# Patient Record
Sex: Male | Born: 1965
Health system: Southern US, Community
[De-identification: ages and names within clinical notes are randomized; demographics above are authoritative.]

## PROBLEM LIST (undated history)

## (undated) DIAGNOSIS — M1990 Unspecified osteoarthritis, unspecified site: Secondary | ICD-10-CM

## (undated) HISTORY — DX: Unspecified osteoarthritis, unspecified site: M19.90

## (undated) HISTORY — PX: FOREIGN BODY REMOVAL: SHX962

---

## 2001-08-08 HISTORY — PX: APPENDECTOMY: SHX54

## 2008-08-08 HISTORY — PX: OTHER SURGICAL HISTORY: SHX169

## 2011-08-09 HISTORY — PX: OTHER SURGICAL HISTORY: SHX169

## 2020-02-03 ENCOUNTER — Ambulatory Visit (INDEPENDENT_AMBULATORY_CARE_PROVIDER_SITE_OTHER): Payer: 59 | Admitting: Physician Assistant

## 2020-02-03 ENCOUNTER — Other Ambulatory Visit: Payer: Self-pay

## 2020-02-03 ENCOUNTER — Encounter: Payer: Self-pay | Admitting: Physician Assistant

## 2020-02-03 VITALS — BP 162/88 | HR 82 | Temp 98.2°F | Ht 74.0 in | Wt 238.4 lb

## 2020-02-03 DIAGNOSIS — N50811 Right testicular pain: Secondary | ICD-10-CM | POA: Diagnosis not present

## 2020-02-03 DIAGNOSIS — G8929 Other chronic pain: Secondary | ICD-10-CM | POA: Insufficient documentation

## 2020-02-03 DIAGNOSIS — R200 Anesthesia of skin: Secondary | ICD-10-CM | POA: Diagnosis not present

## 2020-02-03 DIAGNOSIS — Z1211 Encounter for screening for malignant neoplasm of colon: Secondary | ICD-10-CM

## 2020-02-03 DIAGNOSIS — M25511 Pain in right shoulder: Secondary | ICD-10-CM

## 2020-02-03 DIAGNOSIS — M25561 Pain in right knee: Secondary | ICD-10-CM

## 2020-02-03 DIAGNOSIS — R030 Elevated blood-pressure reading, without diagnosis of hypertension: Secondary | ICD-10-CM

## 2020-02-03 DIAGNOSIS — R351 Nocturia: Secondary | ICD-10-CM

## 2020-02-03 DIAGNOSIS — N50812 Left testicular pain: Secondary | ICD-10-CM | POA: Diagnosis not present

## 2020-02-03 DIAGNOSIS — I10 Essential (primary) hypertension: Secondary | ICD-10-CM | POA: Insufficient documentation

## 2020-02-03 DIAGNOSIS — G4739 Other sleep apnea: Secondary | ICD-10-CM

## 2020-02-03 LAB — COMPREHENSIVE METABOLIC PANEL
ALT: 18 U/L (ref 0–53)
AST: 17 U/L (ref 0–37)
Albumin: 4.7 g/dL (ref 3.5–5.2)
Alkaline Phosphatase: 49 U/L (ref 39–117)
BUN: 19 mg/dL (ref 6–23)
CO2: 31 mEq/L (ref 19–32)
Calcium: 10.2 mg/dL (ref 8.4–10.5)
Chloride: 102 mEq/L (ref 96–112)
Creatinine, Ser: 0.95 mg/dL (ref 0.40–1.50)
GFR: 82.09 mL/min (ref >60.00)
Glucose, Bld: 98 mg/dL (ref 70–99)
Potassium: 4 mEq/L (ref 3.5–5.1)
Sodium: 141 mEq/L (ref 135–145)
Total Bilirubin: 0.8 mg/dL (ref 0.2–1.2)
Total Protein: 7.2 g/dL (ref 6.0–8.3)

## 2020-02-03 LAB — CBC WITH DIFFERENTIAL/PLATELET
Basophils Absolute: 0 10*3/uL (ref 0.0–0.1)
Basophils Relative: 0.7 % (ref 0.0–3.0)
Eosinophils Absolute: 0.1 10*3/uL (ref 0.0–0.7)
Eosinophils Relative: 1.1 % (ref 0.0–5.0)
HCT: 44.3 % (ref 39.0–52.0)
Hemoglobin: 15.5 g/dL (ref 13.0–17.0)
Lymphocytes Relative: 23.9 % (ref 12.0–46.0)
Lymphs Abs: 1.7 10*3/uL (ref 0.7–4.0)
MCHC: 34.9 g/dL (ref 30.0–36.0)
MCV: 89.5 fl (ref 78.0–100.0)
Monocytes Absolute: 0.6 10*3/uL (ref 0.1–1.0)
Monocytes Relative: 8.8 % (ref 3.0–12.0)
Neutro Abs: 4.7 10*3/uL (ref 1.4–7.7)
Neutrophils Relative %: 65.5 % (ref 43.0–77.0)
Platelets: 216 10*3/uL (ref 150.0–400.0)
RBC: 4.95 Mil/uL (ref 4.22–5.81)
RDW: 13.5 % (ref 11.5–15.5)
WBC: 7.1 10*3/uL (ref 4.0–10.5)

## 2020-02-03 LAB — URINALYSIS, ROUTINE W REFLEX MICROSCOPIC
Bilirubin Urine: NEGATIVE
Hgb urine dipstick: NEGATIVE
Ketones, ur: NEGATIVE
Leukocytes,Ua: NEGATIVE
Nitrite: NEGATIVE
RBC / HPF: NONE SEEN (ref 0–?)
Specific Gravity, Urine: 1.02 (ref 1.000–1.030)
Total Protein, Urine: NEGATIVE
Urine Glucose: NEGATIVE
Urobilinogen, UA: 0.2 (ref 0.0–1.0)
pH: 6.5 (ref 5.0–8.0)

## 2020-02-03 LAB — PSA: PSA: 1.34 ng/mL (ref 0.10–4.00)

## 2020-02-03 LAB — TSH: TSH: 1.05 u[IU]/mL (ref 0.35–4.50)

## 2020-02-03 LAB — VITAMIN B12: Vitamin B-12: 357 pg/mL (ref 211–911)

## 2020-02-03 NOTE — Patient Instructions (Addendum)
It was great to see you!  You will be contacted about your referral to gastroenterology, orthopedics, and neurology (neurology is for the sleep study.)  I will be in touch regarding your lab results.  Please follow-up with Dr. Jerline Pain in 1-2 months.  Take care,  Inda Coke PA-C
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
## 2020-02-04 LAB — URINE CULTURE
MICRO NUMBER:: 10641920
SPECIMEN QUALITY:: ADEQUATE

## 2020-02-13 ENCOUNTER — Ambulatory Visit (INDEPENDENT_AMBULATORY_CARE_PROVIDER_SITE_OTHER): Payer: 59 | Admitting: Orthopaedic Surgery

## 2020-02-13 ENCOUNTER — Other Ambulatory Visit: Payer: Self-pay

## 2020-02-13 ENCOUNTER — Encounter: Payer: Self-pay | Admitting: Orthopaedic Surgery

## 2020-02-13 ENCOUNTER — Ambulatory Visit: Payer: Self-pay

## 2020-02-13 VITALS — Ht 74.0 in | Wt 238.0 lb

## 2020-02-13 DIAGNOSIS — M25511 Pain in right shoulder: Secondary | ICD-10-CM

## 2020-02-13 DIAGNOSIS — M7541 Impingement syndrome of right shoulder: Secondary | ICD-10-CM | POA: Insufficient documentation

## 2020-02-13 DIAGNOSIS — G8929 Other chronic pain: Secondary | ICD-10-CM | POA: Diagnosis not present

## 2020-02-13 DIAGNOSIS — M25561 Pain in right knee: Secondary | ICD-10-CM | POA: Diagnosis not present

## 2020-02-13 MED ORDER — METHYLPREDNISOLONE ACETATE 40 MG/ML IJ SUSP
80.0000 mg | INTRAMUSCULAR | Status: AC | PRN
  Administered 2020-02-13: 80 mg via INTRA_ARTICULAR

## 2020-02-13 MED ORDER — LIDOCAINE HCL 1 % IJ SOLN
2.0000 mL | INTRAMUSCULAR | Status: AC | PRN
  Administered 2020-02-13: 2 mL

## 2020-02-13 MED ORDER — BUPIVACAINE HCL 0.5 % IJ SOLN
2.0000 mL | INTRAMUSCULAR | Status: AC | PRN
  Administered 2020-02-13: 2 mL via INTRA_ARTICULAR
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
## 2020-03-05 ENCOUNTER — Other Ambulatory Visit: Payer: Self-pay

## 2020-03-05 ENCOUNTER — Encounter: Payer: Self-pay | Admitting: Family Medicine

## 2020-03-05 ENCOUNTER — Ambulatory Visit (INDEPENDENT_AMBULATORY_CARE_PROVIDER_SITE_OTHER): Payer: 59 | Admitting: Family Medicine

## 2020-03-05 DIAGNOSIS — R030 Elevated blood-pressure reading, without diagnosis of hypertension: Secondary | ICD-10-CM | POA: Diagnosis not present

## 2020-03-05 DIAGNOSIS — M1990 Unspecified osteoarthritis, unspecified site: Secondary | ICD-10-CM

## 2020-03-05 NOTE — Assessment & Plan Note (Signed)
Slightly above goal today.  He will continue home monitoring goal 140/90 or lower.  He will let me know if persistently elevated above this.  Discussed lifestyle modifications including low-salt diet and regular exercise.

## 2020-03-05 NOTE — Progress Notes (Addendum)
Chief Complaint:  Shane Miles is a 54 y.o. male who presents today for his annual comprehensive physical exam.    Assessment/Plan:  Chronic Problems Addressed Today: Osteoarthritis Stable.  Continue management per orthopedics.  Elevated blood pressure reading Slightly above goal today.  He will continue home monitoring goal 140/90 or lower.  He will let me know if persistently elevated above this.  Discussed lifestyle modifications including low-salt diet and regular exercise.  Preventative Healthcare: Referral already placed for colonoscopy.  Labs from visit a month ago within normal limits.  He will follow-up in 1 year for next CPE.  Encourage patient to get shingles vaccine at pharmacy.  Patient Counseling(The following topics were reviewed and/or handout was given):  -Nutrition: Stressed importance of moderation in sodium/caffeine intake, saturated fat and cholesterol, caloric balance, sufficient intake of fresh fruits, vegetables, and fiber.  -Stressed the importance of regular exercise.   -Substance Abuse: Discussed cessation/primary prevention of tobacco, alcohol, or other drug use; driving or other dangerous activities under the influence; availability of treatment for abuse.   -Injury prevention: Discussed safety belts, safety helmets, smoke detector, smoking near bedding or upholstery.   -Sexuality: Discussed sexually transmitted diseases, partner selection, use of condoms, avoidance of unintended pregnancy and contraceptive alternatives.   -Dental health: Discussed importance of regular tooth brushing, flossing, and dental visits.  -Health maintenance and immunizations reviewed. Please refer to Health maintenance section.  Return to care in 1 year for next preventative visit.     Subjective:  HPI:  He has no acute complaints today.   Lifestyle Diet: None specific.  Exercise: Tries to get at least 10000 steps per day.   Depression screen PHQ 2/9 02/03/2020  Decreased  Interest 0  Down, Depressed, Hopeless 0  PHQ - 2 Score 0   Health Maintenance Due  Topic Date Due  . Hepatitis C Screening  Never done  . HIV Screening  Never done  . TETANUS/TDAP  Never done  . COLONOSCOPY  Never done   ROS: Per HPI, otherwise a complete review of systems was negative.   PMH:  The following were reviewed and entered/updated in epic: History reviewed. No pertinent past medical history. Patient Active Problem List   Diagnosis Date Noted  . Osteoarthritis 03/05/2020  . Impingement syndrome of right shoulder 02/13/2020  . Elevated blood pressure reading 02/03/2020   Past Surgical History:  Procedure Laterality Date  . left knee meniscus  2010  . right knee meniscus  2013    Family History  Problem Relation Age of Onset  . Arthritis Mother   . Hypertension Mother   . Hypertension Father     Medications- reviewed and updated Current Outpatient Medications  Medication Sig Dispense Refill  . glucosamine-chondroitin 500-400 MG tablet Take 1 tablet by mouth 3 (three) times daily.    Marland Kitchen ibuprofen (ADVIL) 200 MG tablet Take 200 mg by mouth every 6 (six) hours as needed.    . Omega-3 Fatty Acids (FISH OIL) 1000 MG CAPS Take by mouth.     No current facility-administered medications for this visit.    Allergies-reviewed and updated No Known Allergies  Social History   Socioeconomic History  . Marital status: Married    Spouse name: Not on file  . Number of children: Not on file  . Years of education: Not on file  . Highest education level: Not on file  Occupational History  . Not on file  Tobacco Use  . Smoking status: Never Smoker  .  Smokeless tobacco: Never Used  Substance and Sexual Activity  . Alcohol use: Never  . Drug use: Never  . Sexual activity: Yes  Other Topics Concern  . Not on file  Social History Narrative   Married   Works in [place] Strain:   . Difficulty of Paying  Living Expenses:   Food Insecurity:   . Worried About Charity fundraiser in the Last Year:   . Arboriculturist in the Last Year:   Transportation Needs:   . Film/video editor (Medical):   Marland Kitchen Lack of Transportation (Non-Medical):   Physical Activity:   . Days of Exercise per Week:   . Minutes of Exercise per Session:   Stress:   . Feeling of Stress :   Social Connections:   . Frequency of Communication with Friends and Family:   . Frequency of Social Gatherings with Friends and Family:   . Attends Religious Services:   . Active Member of Clubs or Organizations:   . Attends Archivist Meetings:   Marland Kitchen Marital Status:         Objective:  Physical Exam: BP (!) 150/90 (BP Location: Left Arm, Patient Position: Sitting, Cuff Size: Large)   Pulse 61   Temp 97.8 F (36.6 C) (Temporal)   Ht 6\' 2"  (1.88 m)   Wt (!) 241 lb 8 oz (109.5 kg)   BMI 31.01 kg/m   Body mass index is 31.01 kg/m. Wt Readings from Last 3 Encounters:  03/05/20 (!) 241 lb 8 oz (109.5 kg)  02/13/20 238 lb (108 kg)  02/03/20 238 lb 6.4 oz (108.1 kg)   Gen: NAD, resting comfortably HEENT:  No thyromegaly noted.  CV: RRR with no murmurs appreciated Pulm: NWOB, CTAB with no crackles, wheezes, or rhonchi GI: Normal bowel sounds present. Soft, Nontender, Nondistended. MSK: no edema, cyanosis, or clubbing noted Skin: warm, dry Neuro: CN2-12 grossly intact. Moves all extremities Psych: Normal affect and thought content     Oluwaseyi Tull M. Jerline Pain, MD 03/05/2020 9:25 AM

## 2020-03-05 NOTE — Patient Instructions (Signed)
It was very nice to see you today!  Benign your blood pressure.  Let me know if persistently 140/90 or higher.  Please schedule appoint with urology soon.  I will see back in year for your next annual physical with blood work.  Please come back to see me sooner if needed.  Take care, Dr Jerline Pain  Please try these tips to maintain a healthy lifestyle:   Eat at least 3 REAL meals and 1-2 snacks per day.  Aim for no more than 5 hours between eating.  If you eat breakfast, please do so within one hour of getting up.    Each meal should contain half fruits/vegetables, one quarter protein, and one quarter carbs (no bigger than a computer mouse)   Cut down on sweet beverages. This includes juice, soda, and sweet tea.     Drink at least 1 glass of water with each meal and aim for at least 8 glasses per day   Exercise at least 150 minutes every week.    Preventive Care 61-80 Years Old, Male Preventive care refers to lifestyle choices and visits with your health care provider that can promote health and wellness. This includes:  A yearly physical exam. This is also called an annual well check.  Regular dental and eye exams.  Immunizations.  Screening for certain conditions.  Healthy lifestyle choices, such as eating a healthy diet, getting regular exercise, not using drugs or products that contain nicotine and tobacco, and limiting alcohol use. What can I expect for my preventive care visit? Physical exam Your health care provider will check:  Height and weight. These may be used to calculate body mass index (BMI), which is a measurement that tells if you are at a healthy weight.  Heart rate and blood pressure.  Your skin for abnormal spots. Counseling Your health care provider may ask you questions about:  Alcohol, tobacco, and drug use.  Emotional well-being.  Home and relationship well-being.  Sexual activity.  Eating habits.  Work and work Statistician. What  immunizations do I need?  Influenza (flu) vaccine  This is recommended every year. Tetanus, diphtheria, and pertussis (Tdap) vaccine  You may need a Td booster every 10 years. Varicella (chickenpox) vaccine  You may need this vaccine if you have not already been vaccinated. Zoster (shingles) vaccine  You may need this after age 80. Measles, mumps, and rubella (MMR) vaccine  You may need at least one dose of MMR if you were born in 1957 or later. You may also need a second dose. Pneumococcal conjugate (PCV13) vaccine  You may need this if you have certain conditions and were not previously vaccinated. Pneumococcal polysaccharide (PPSV23) vaccine  You may need one or two doses if you smoke cigarettes or if you have certain conditions. Meningococcal conjugate (MenACWY) vaccine  You may need this if you have certain conditions. Hepatitis A vaccine  You may need this if you have certain conditions or if you travel or work in places where you may be exposed to hepatitis A. Hepatitis B vaccine  You may need this if you have certain conditions or if you travel or work in places where you may be exposed to hepatitis B. Haemophilus influenzae type b (Hib) vaccine  You may need this if you have certain risk factors. Human papillomavirus (HPV) vaccine  If recommended by your health care provider, you may need three doses over 6 months. You may receive vaccines as individual doses or as more than one  vaccine together in one shot (combination vaccines). Talk with your health care provider about the risks and benefits of combination vaccines. What tests do I need? Blood tests  Lipid and cholesterol levels. These may be checked every 5 years, or more frequently if you are over 38 years old.  Hepatitis C test.  Hepatitis B test. Screening  Lung cancer screening. You may have this screening every year starting at age 20 if you have a 30-pack-year history of smoking and currently smoke  or have quit within the past 15 years.  Prostate cancer screening. Recommendations will vary depending on your family history and other risks.  Colorectal cancer screening. All adults should have this screening starting at age 59 and continuing until age 31. Your health care provider may recommend screening at age 87 if you are at increased risk. You will have tests every 1-10 years, depending on your results and the type of screening test.  Diabetes screening. This is done by checking your blood sugar (glucose) after you have not eaten for a while (fasting). You may have this done every 1-3 years.  Sexually transmitted disease (STD) testing. Follow these instructions at home: Eating and drinking  Eat a diet that includes fresh fruits and vegetables, whole grains, lean protein, and low-fat dairy products.  Take vitamin and mineral supplements as recommended by your health care provider.  Do not drink alcohol if your health care provider tells you not to drink.  If you drink alcohol: ? Limit how much you have to 0-2 drinks a day. ? Be aware of how much alcohol is in your drink. In the [place] one drink equals one 12 oz bottle of beer (355 mL), one 5 oz glass of wine (148 mL), or one 1 oz glass of hard liquor (44 mL). Lifestyle  Take daily care of your teeth and gums.  Stay active. Exercise for at least 30 minutes on 5 or more days each week.  Do not use any products that contain nicotine or tobacco, such as cigarettes, e-cigarettes, and chewing tobacco. If you need help quitting, ask your health care provider.  If you are sexually active, practice safe sex. Use a condom or other form of protection to prevent STIs (sexually transmitted infections).  Talk with your health care provider about taking a low-dose aspirin every day starting at age 56. What's next?  Go to your health care provider once a year for a well check visit.  Ask your health care provider how often you should have  your eyes and teeth checked.  Stay up to date on all vaccines. This information is not intended to replace advice given to you by your health care provider. Make sure you discuss any questions you have with your health care provider. Document Revised: 07/19/2018 Document Reviewed: 07/19/2018 Elsevier Patient Education  2020 [employer].

## 2020-03-05 NOTE — Assessment & Plan Note (Signed)
Stable.  Continue management per orthopedics. 

## 2020-04-01 ENCOUNTER — Encounter: Payer: Self-pay | Admitting: Gastroenterology

## 2020-05-22 ENCOUNTER — Other Ambulatory Visit: Payer: Self-pay

## 2020-05-22 ENCOUNTER — Ambulatory Visit (AMBULATORY_SURGERY_CENTER): Payer: 59 | Admitting: *Deleted

## 2020-05-22 VITALS — Ht 74.0 in | Wt 240.0 lb

## 2020-05-22 DIAGNOSIS — Z1211 Encounter for screening for malignant neoplasm of colon: Secondary | ICD-10-CM

## 2020-05-22 MED ORDER — NA SULFATE-K SULFATE-MG SULF 17.5-3.13-1.6 GM/177ML PO SOLN: ORAL | 0 refills | Status: DC

## 2020-05-22 NOTE — Progress Notes (Signed)
Patient's pre-visit was done today over the phone with the patient due to COVID-19 pandemic. Name,DOB and address verified. Insurance verified. Packet of Prep instructions mailed to patient including copy of a consent form and pre-procedure patient acknowledgement form-pt is aware. suprep Coupon included. Patient understands to call us back with any questions or concerns. COVID-19 vaccines completed 12/13/19 per pt. Pt is aware that care partner will wait in the car during procedure; if they feel like they will be too hot or cold to wait in the car; they may wait in the 4 th floor lobby. Patient is aware to bring only one care partner. We want them to wear a mask (we do not have any that we can provide them), practice social distancing, and we will check their temperatures when they get here.  I did remind the patient that their care partner needs to stay in the parking lot the entire time and have a cell phone available, we will call them when the pt is ready for discharge. Patient will wear mask into building.

## 2020-05-25 ENCOUNTER — Encounter: Payer: Self-pay | Admitting: Gastroenterology

## 2020-06-05 ENCOUNTER — Encounter: Payer: Self-pay | Admitting: Gastroenterology

## 2020-06-05 ENCOUNTER — Ambulatory Visit: Payer: 59 | Admitting: Gastroenterology

## 2020-06-05 ENCOUNTER — Other Ambulatory Visit: Payer: Self-pay

## 2020-06-05 ENCOUNTER — Emergency Department (HOSPITAL_COMMUNITY)
Admission: EM | Admit: 2020-06-05 | Discharge: 2020-06-05 | Disposition: A | Discharge status: Home / Self Care | Payer: 59 | Attending: Emergency Medicine | Admitting: Emergency Medicine

## 2020-06-05 ENCOUNTER — Encounter (HOSPITAL_COMMUNITY): Payer: Self-pay | Admitting: Emergency Medicine

## 2020-06-05 ENCOUNTER — Emergency Department (HOSPITAL_COMMUNITY): Payer: 59

## 2020-06-05 VITALS — BP 129/101 | HR 121 | Temp 96.9°F | Resp 0 | Ht 74.0 in | Wt 240.0 lb

## 2020-06-05 DIAGNOSIS — R9431 Abnormal electrocardiogram [ECG] [EKG]: Secondary | ICD-10-CM | POA: Diagnosis present

## 2020-06-05 DIAGNOSIS — I4891 Unspecified atrial fibrillation: Secondary | ICD-10-CM | POA: Diagnosis not present

## 2020-06-05 DIAGNOSIS — Z1211 Encounter for screening for malignant neoplasm of colon: Secondary | ICD-10-CM

## 2020-06-05 LAB — BASIC METABOLIC PANEL
Anion gap: 11 (ref 5–15)
BUN: 12 mg/dL (ref 6–20)
CO2: 23 mmol/L (ref 22–32)
Calcium: 9.7 mg/dL (ref 8.9–10.3)
Chloride: 105 mmol/L (ref 98–111)
Creatinine, Ser: 1.03 mg/dL (ref 0.61–1.24)
GFR, Estimated: >60 mL/min (ref >60)
Glucose, Bld: 99 mg/dL (ref 70–99)
Potassium: 4 mmol/L (ref 3.5–5.1)
Sodium: 139 mmol/L (ref 135–145)

## 2020-06-05 LAB — CBC
HCT: 46.2 % (ref 39.0–52.0)
Hemoglobin: 16.1 g/dL (ref 13.0–17.0)
MCH: 31.3 pg (ref 26.0–34.0)
MCHC: 34.8 g/dL (ref 30.0–36.0)
MCV: 89.7 fL (ref 80.0–100.0)
Platelets: 244 10*3/uL (ref 150–400)
RBC: 5.15 MIL/uL (ref 4.22–5.81)
RDW: 11.9 % (ref 11.5–15.5)
WBC: 7.2 10*3/uL (ref 4.0–10.5)
nRBC: 0 % (ref 0.0–0.2)

## 2020-06-05 LAB — TROPONIN I (HIGH SENSITIVITY): Troponin I (High Sensitivity): 2 ng/L (ref <18)

## 2020-06-05 LAB — MAGNESIUM: Magnesium: 2.1 mg/dL (ref 1.7–2.4)

## 2020-06-05 LAB — TSH: TSH: 1.658 u[IU]/mL (ref 0.350–4.500)

## 2020-06-05 MED ORDER — ASPIRIN EC 325 MG PO TBEC: 325.0000 mg | DELAYED_RELEASE_TABLET | Freq: Every day | ORAL | 0 refills | Status: DC

## 2020-06-05 MED ORDER — SODIUM CHLORIDE 0.9 % IV SOLN: 500.0000 mL | Freq: Once | INTRAVENOUS | Status: DC

## 2020-06-05 MED ORDER — METOPROLOL TARTRATE 25 MG PO TABS: 25.0000 mg | ORAL_TABLET | Freq: Two times a day (BID) | ORAL | 0 refills | Status: DC

## 2020-06-05 MED ORDER — METOPROLOL TARTRATE 25 MG PO TABS
25.0000 mg | ORAL_TABLET | Freq: Once | ORAL | Status: AC
  Administered 2020-06-05: 25 mg via ORAL
  Filled 2020-06-05: qty 1

## 2020-06-05 MED ORDER — METOPROLOL TARTRATE 5 MG/5ML IV SOLN
5.0000 mg | INTRAVENOUS | Status: DC | PRN
  Administered 2020-06-05: 5 mg via INTRAVENOUS
  Filled 2020-06-05: qty 5

## 2020-06-05 NOTE — Progress Notes (Signed)
Pt hooked to monitor in procedure room.  EKG showed a-fib with RVR.  Pt doesn't have a history of a-fib.  He said his father has it and he has felt his heart racing recently.  Dr Lenna Sciara notified.  Procedure cancelled and pt moved to PACU and 12 lead obtained.

## 2020-06-05 NOTE — Progress Notes (Signed)
In endo room he was found to be in rapid atrial fibrillation.  He has NO cp or SOB. He does feel the fluttering a bit and has felt this intermittently for the past 2- 3 weeks or so.  We are getting a 12 lead ekg, colonoscopy is cancelled. Will be sending him to ER, probably by his car wife driving.

## 2020-06-05 NOTE — ED Provider Notes (Signed)
Emergency Department Provider Note   I have reviewed the triage vital signs and the nursing notes.   HISTORY  Chief Complaint Abnormal ECG   HPI Shane Miles is a 54 y.o. male with PMH of intermittent palpitations in the last few week but this AM went for a routine colonoscopy this AM and was found to be in A-fib with RVR and sent to the ED. she denies any chest pain or shortness of breath.  He states he was feeling somewhat nervous this morning before the procedure but denies any severe symptoms of heart palpitations or lightheadedness.  He has no significant past medical history that he is aware of and takes no prescription medications.  He does have a family history of atrial fibrillation. No fever, chills, or infection symptoms.   Past Medical History:  Diagnosis Date  . Arthritis     Patient Active Problem List   Diagnosis Date Noted  . Osteoarthritis 03/05/2020  . Impingement syndrome of right shoulder 02/13/2020  . Elevated blood pressure reading 02/03/2020    Past Surgical History:  Procedure Laterality Date  . APPENDECTOMY  2003  . FOREIGN BODY REMOVAL Right    right thing  . left knee meniscus  2010  . right knee meniscus  2013    Allergies Patient has no known allergies.  Family History  Problem Relation Age of Onset  . Arthritis Mother   . Hypertension Mother   . Hypertension Father   . Colon cancer Neg Hx   . Colon polyps Neg Hx   . Esophageal cancer Neg Hx   . Rectal cancer Neg Hx   . Stomach cancer Neg Hx     Social History Social History   Tobacco Use  . Smoking status: Never Smoker  . Smokeless tobacco: Never Used  Vaping Use  . Vaping Use: Never used  Substance Use Topics  . Alcohol use: Never  . Drug use: Never    Review of Systems  Constitutional: No fever/chills Eyes: No visual changes. ENT: No sore throat. Cardiovascular: Denies chest pain positive intermittent palpitations.  Respiratory: Denies shortness of  breath. Gastrointestinal: No abdominal pain.  No nausea, no vomiting.  No diarrhea.  No constipation. Genitourinary: Negative for dysuria. Musculoskeletal: Negative for back pain. Skin: Negative for rash. Neurological: Negative for headaches, focal weakness or numbness.  10-point ROS otherwise negative.  ____________________________________________   PHYSICAL EXAM:  VITAL SIGNS: ED Triage Vitals  Enc Vitals Group     BP 06/05/20 1134 (!) 149/90     Pulse Rate 06/05/20 1134 (!) 122     Resp 06/05/20 1134 18     Temp 06/05/20 1134 98.7 F (37.1 C)     Temp Source 06/05/20 1134 Oral     SpO2 06/05/20 1134 97 %     Weight 06/05/20 1134 230 lb (104.3 kg)     Height 06/05/20 1134 6\' 2"  (1.88 m)   Constitutional: Alert and oriented. Well appearing and in no acute distress. Eyes: Conjunctivae are normal.  Head: Atraumatic. Nose: No congestion/rhinnorhea. Mouth/Throat: Mucous membranes are moist.   Neck: No stridor.   Cardiovascular: Irregularly irregular. Good peripheral circulation. Grossly normal heart sounds.   Respiratory: Normal respiratory effort.  No retractions. Lungs CTAB. Gastrointestinal: Soft and nontender. No distention.  Musculoskeletal: No lower extremity tenderness nor edema.  Neurologic:  Normal speech and language.  Skin:  Skin is warm, dry and intact. No rash noted.  ____________________________________________   LABS (all labs ordered are listed, but  only abnormal results are displayed)  Labs Reviewed  BASIC METABOLIC PANEL  CBC  MAGNESIUM  TSH  TROPONIN I (HIGH SENSITIVITY)   ____________________________________________  EKG   EKG Interpretation  Date/Time:  Friday June 05 2020 12:33:15 EDT Ventricular Rate:  97 PR Interval:    QRS Duration: 94 QT Interval:  358 QTC Calculation: 455 R Axis:   59 Text Interpretation: Atrial fibrillation Confirmed by Madalyn Rob [phone] on 06/06/2020 10:50:03 AM        ____________________________________________  RADIOLOGY  CXR reviewed.  ____________________________________________   PROCEDURES  Procedure(s) performed:   Procedures  None  ____________________________________________   INITIAL IMPRESSION / ASSESSMENT AND PLAN / ED COURSE  Pertinent labs & imaging results that were available during my care of the patient were reviewed by me and considered in my medical decision making (see chart for details).   Patient arrives to the emergency department with A. fib and RVR.  His blood pressure is normal.  He is in A. fib with mild RVR here.  Plan for IV and p.o. metoprolol.  CHA2DS2-VASc score of 0 on my calculation. Have referred to the A-fib clinic. Patient will be a rate control candidate here in the emergency department.  He is not a cardioversion candidate because he has had several instances of mild palpitation symptoms over the past several weeks.  Patient able to be rate controlled here with PO Metoprolol. No IV meds or infusions required. With low CHA2DS2-VASc score patient will start ASA and f/u with Cardiology. A-fib clinic referral placed. Discussed ED return precautions in detail.   At this time, I do not feel there is any life-threatening condition present. I have reviewed and discussed all results (EKG, imaging, lab, urine as appropriate), exam findings with patient. I have reviewed nursing notes and appropriate previous records.  I feel the patient is safe to be discharged home without further emergent workup. Discussed usual and customary return precautions. Patient and family (if present) verbalize understanding and are comfortable with this plan.  Patient will follow-up with their primary care provider. If they do not have a primary care provider, information for follow-up has been provided to them. All questions have been answered.  ____________________________________________  FINAL CLINICAL IMPRESSION(S) / ED  DIAGNOSES  Final diagnoses:  Atrial fibrillation with RVR ([place])     MEDICATIONS GIVEN DURING THIS VISIT:  Medications  metoprolol tartrate (LOPRESSOR) tablet 25 mg (25 mg Oral Given 06/05/20 1228)     NEW OUTPATIENT MEDICATIONS STARTED DURING THIS VISIT:  Discharge Medication List as of 06/05/2020  1:34 PM    START taking these medications   Details  aspirin EC 325 MG tablet Take 1 tablet (325 mg total) by mouth daily., Starting Fri 06/05/2020, Normal    metoprolol tartrate (LOPRESSOR) 25 MG tablet Take 1 tablet (25 mg total) by mouth 2 (two) times daily., Starting Fri 06/05/2020, Until Sun 07/05/2020, Normal        Note:  This document was prepared using Dragon voice recognition software and may include unintentional dictation errors.  Nanda Quinton, MD, [facility] Emergency Medicine    Dominque Marlin, [facility], MD 06/09/20 [phone]

## 2020-06-05 NOTE — ED Notes (Signed)
Reviewed discharge instructions with patient. Follow-up care and medications reviewed. Patient  verbalized understanding. Patient A&Ox4, VSS, and ambulatory with steady gait upon discharge.  °

## 2020-06-05 NOTE — Discharge Instructions (Signed)
NormalYou were seen in the emergency room today with an abnormal heart rhythm called atrial fibrillation.  I am starting you on a new medication called metoprolol to take twice daily which should keep your heart rate range.  If you notice your heart rate increases suddenly, you develop chest pain, shortness of breath, lightheadedness, passing out he should return to the emergency department immediately for evaluation.  The A. fib clinic should be reaching out to you likely today regarding your follow-up appointment with them.  I would like for you to call them this afternoon to confirm your appointment.   We discussed your risk of forming blood clots with this medication.  You are very low risk at this time and we have elected to start a full dose aspirin (325 mg) daily until you see the cardiology team.  They can have further discussion with you regarding continuing this or starting full anticoagulation at that appointment.

## 2020-06-05 NOTE — ED Triage Notes (Signed)
Pt send to ED from GI doctor for abnormal EKG, pt is on afib with HR 130, pt denies any sob or dizziness, no pain. Pt denies any prior hx of cardiac problem.

## 2020-06-05 NOTE — Progress Notes (Signed)
Patient was in the procedure room and on EKG was found to be in Atrial Fibrillation. He said that he had been experiencing a faster heart rate that had been coming and going the last 2 weeks. !2-Lead EKG was obtained showing A fib with RVR. Wife was brought back to recovery and Dr. Ardis Hughs talked with the patient and wife and decided to send them to [facility] ED via personal car.  The triage Nurse was notified of their incoming arrival.

## 2020-06-05 NOTE — Progress Notes (Signed)
Medical history reviewed with no changes noted. VS assessed by C.W 

## 2020-06-05 NOTE — ED Notes (Signed)
Pt reports he noted his HR being high for a few weeks intermittently, denies CP, palpitation, shob. Pt is asymptomatic at this time. Pt reports he was at his GI MD for a colonoscopy and it was noted that he was in A-fib RVR.

## 2020-06-10 ENCOUNTER — Other Ambulatory Visit: Payer: Self-pay

## 2020-06-10 ENCOUNTER — Ambulatory Visit (HOSPITAL_COMMUNITY)
Admission: RE | Admit: 2020-06-10 | Discharge: 2020-06-10 | Disposition: A | Discharge status: Home / Self Care | Payer: 59 | Source: Ambulatory Visit | Attending: Nurse Practitioner | Admitting: Nurse Practitioner

## 2020-06-10 ENCOUNTER — Encounter (HOSPITAL_COMMUNITY): Payer: Self-pay | Admitting: Nurse Practitioner

## 2020-06-10 VITALS — BP 132/94 | HR 112 | Ht 74.0 in | Wt 240.0 lb

## 2020-06-10 DIAGNOSIS — R0683 Snoring: Secondary | ICD-10-CM | POA: Diagnosis not present

## 2020-06-10 DIAGNOSIS — I480 Paroxysmal atrial fibrillation: Secondary | ICD-10-CM | POA: Diagnosis not present

## 2020-06-10 DIAGNOSIS — Z8679 Personal history of other diseases of the circulatory system: Secondary | ICD-10-CM | POA: Insufficient documentation

## 2020-06-10 DIAGNOSIS — Z79899 Other long term (current) drug therapy: Secondary | ICD-10-CM | POA: Diagnosis not present

## 2020-06-10 DIAGNOSIS — I4891 Unspecified atrial fibrillation: Secondary | ICD-10-CM | POA: Insufficient documentation

## 2020-06-10 DIAGNOSIS — R0681 Apnea, not elsewhere classified: Secondary | ICD-10-CM | POA: Insufficient documentation

## 2020-06-10 MED ORDER — RIVAROXABAN 20 MG PO TABS: 20.0000 mg | ORAL_TABLET | Freq: Every day | ORAL | 2 refills | Status: DC

## 2020-06-10 NOTE — Progress Notes (Signed)
Primary Care Physician: [name], MD Referring Physician: [facility] ER f/u    Shane Miles is a 54 y.o. male with no medical history that presented for an colonoscopy 10/29/21and found to be in afib. He was sent to the ER. He was sent home on metoprolol and asa for a CHA2DS2VASc score of 0. HE has noted palpitations over the last 3 weeks. He is not terribly symptomatic with afib. He did not take his am BB this am and has a HR over 110. HE has not tracked at home to see HR control and if he is persistent or paroxysmal. I discussed with him starting anticoagulation  to see if a CV may be in the future.   He denies any tobacco use, no alcohol, has stopped caffeine. He has been told by his wife that he snores and has had witnessed apnea. If he sleeps on his side he snores less. His father has afib as well. Denies a bleeding history.    Today, he denies symptoms of palpitations, chest pain, shortness of breath, orthopnea, PND, lower extremity edema, dizziness, presyncope, syncope, or neurologic sequela. The patient is tolerating medications without difficulties and is otherwise without complaint today.   Past Medical History:  Diagnosis Date  . Arthritis    Past Surgical History:  Procedure Laterality Date  . APPENDECTOMY  2003  . FOREIGN BODY REMOVAL Right    right thing  . left knee meniscus  2010  . right knee meniscus  2013    Current Outpatient Medications  Medication Sig Dispense Refill  . ibuprofen (ADVIL) 200 MG tablet Take 200 mg by mouth as needed.     . metoprolol tartrate (LOPRESSOR) 25 MG tablet Take 1 tablet (25 mg total) by mouth 2 (two) times daily. 60 tablet 0  . rivaroxaban (XARELTO) 20 MG TABS tablet Take 1 tablet (20 mg total) by mouth daily with supper. 30 tablet 2   Current Facility-Administered Medications  Medication Dose Route Frequency Provider Last Rate Last Admin  . 0.9 %  sodium chloride infusion  500 mL Intravenous Once Milus Banister, MD        No  Known Allergies  Social History   Socioeconomic History  . Marital status: Married    Spouse name: Not on file  . Number of children: Not on file  . Years of education: Not on file  . Highest education level: Not on file  Occupational History  . Not on file  Tobacco Use  . Smoking status: Never Smoker  . Smokeless tobacco: Never Used  Vaping Use  . Vaping Use: Never used  Substance and Sexual Activity  . Alcohol use: Never  . Drug use: Never  . Sexual activity: Yes  Other Topics Concern  . Not on file  Social History Narrative   Married   Works in [place] Strain:   . Difficulty of Paying Living Expenses: Not on file  Food Insecurity:   . Worried About Charity fundraiser in the Last Year: Not on file  . Ran Out of Food in the Last Year: Not on file  Transportation Needs:   . Lack of Transportation (Medical): Not on file  . Lack of Transportation (Non-Medical): Not on file  Physical Activity:   . Days of Exercise per Week: Not on file  . Minutes of Exercise per Session: Not on file  Stress:   . Feeling of Stress :  Not on file  Social Connections:   . Frequency of Communication with Friends and Family: Not on file  . Frequency of Social Gatherings with Friends and Family: Not on file  . Attends Religious Services: Not on file  . Active Member of Clubs or Organizations: Not on file  . Attends Archivist Meetings: Not on file  . Marital Status: Not on file  Intimate Partner Violence:   . Fear of Current or Ex-Partner: Not on file  . Emotionally Abused: Not on file  . Physically Abused: Not on file  . Sexually Abused: Not on file    Family History  Problem Relation Age of Onset  . Arthritis Mother   . Hypertension Mother   . Hypertension Father   . Colon cancer Neg Hx   . Colon polyps Neg Hx   . Esophageal cancer Neg Hx   . Rectal cancer Neg Hx   . Stomach cancer Neg Hx     ROS- All systems  are reviewed and negative except as per the HPI above  Physical Exam: Vitals:   06/10/20 0833  BP: (!) 132/94  Pulse: (!) 112  Weight: 108.9 kg  Height: 6\' 2"  (1.88 m)   Wt Readings from Last 3 Encounters:  06/10/20 108.9 kg  06/05/20 104.3 kg  06/05/20 108.9 kg    Labs: Lab Results  Component Value Date   NA 139 06/05/2020   K 4.0 06/05/2020   CL 105 06/05/2020   CO2 23 06/05/2020   GLUCOSE 99 06/05/2020   BUN 12 06/05/2020   CREATININE 1.03 06/05/2020   CALCIUM 9.7 06/05/2020   MG 2.1 06/05/2020   No results found for: INR No results found for: CHOL, HDL, LDLCALC, TRIG   GEN- The patient is well appearing, alert and oriented x 3 today.   Head- normocephalic, atraumatic Eyes-  Sclera clear, conjunctiva pink Ears- hearing intact Oropharynx- clear Neck- supple, no JVP Lymph- no cervical lymphadenopathy Lungs- Clear to ausculation bilaterally, normal work of breathing Heart- Regular rate and rhythm, no murmurs, rubs or gallops, PMI not laterally displaced GI- soft, NT, ND, + BS Extremities- no clubbing, cyanosis, or edema MS- no significant deformity or atrophy Skin- no rash or lesion Psych- euthymic mood, full affect Neuro- strength and sensation are intact  EKG-afib at 112 bpm, qrs int 86 ms, qtc 395 ms Epic records reviewed    Assessment and Plan: 1.  New onset  Afib  Found in  the setting of an colonoscopy, but had felt palpitations for  prior 3 weeks General education re afib and triggers Will need an echo when afib rate controlled or back in SR  He will continue metoprolol 25 mg bid and will check his BP ans HR at home over the next couple of days with readings reported to the office Friday. To assess if BB needs adjustment I will also place  a one week Zio Patch to determine if pt is paroxysmal or persistent  2. CHA2DS2VASc score of 0 I discussed starting eliquis 5 mg bid until we know if pt will need a cardioversion  Bleeding risk and precautions  discussed   Denied a bleeding history  Will rx xarelto 20 mg daily  Stop  asa, avoid NSAIDS  I will see pt back after monitor results are known  Butch Penny C. Saliou Barnier, [facility] [address] [place], [place] [address] [phone]

## 2020-06-10 NOTE — Patient Instructions (Signed)
Stop aspirin  Start Xarelto 20mg  once a day  Call Friday with update of blood pressure and heart rate -- [phone]  Wear monitor until next Wednesday Nov 10th

## 2020-06-12 ENCOUNTER — Telehealth (HOSPITAL_COMMUNITY): Payer: Self-pay

## 2020-06-12 NOTE — Telephone Encounter (Signed)
Patient called in with his blood pressure readings and heart rates. Wednesday- 11/3 B/P morning- 133/80  HR 97 Mid day- 122/75 HR 106 Evening- 124/88 HR 95  Thursday-B/P morning 143/98  Mid day- 119/94 HR 96 Evening- 120/81 HR 106  Friday- B/P morning- 135/86  HR 70 Mid morning 124/71 HR 137 Afternoon- 107/62 HR 74  Patient notified to continue what he is doing and no additional medication changes at this time. He started the Eliquis 5mg  on Wednesday night and he is aware to continue taking the blood thinner and to not miss any doses. He states he stopped the Aspirin. I told him we will contact him for follow up appointment once the monitor results are back. Consulted with patient and he verbalized understanding.

## 2020-06-30 ENCOUNTER — Telehealth (HOSPITAL_COMMUNITY): Payer: Self-pay | Admitting: *Deleted

## 2020-06-30 NOTE — Telephone Encounter (Signed)
-----   Message from Sherran Needs, NP sent at 06/30/2020  9:46 AM EST ----- Please  inform pt that he is in afib all the time and will need a cardioversion. Please  give an appointment to get this set up.

## 2020-07-06 ENCOUNTER — Other Ambulatory Visit (HOSPITAL_COMMUNITY): Payer: Self-pay | Admitting: *Deleted

## 2020-07-06 MED ORDER — METOPROLOL TARTRATE 25 MG PO TABS: 25.0000 mg | ORAL_TABLET | Freq: Two times a day (BID) | ORAL | 3 refills | Status: DC

## 2020-07-08 ENCOUNTER — Other Ambulatory Visit: Payer: Self-pay

## 2020-07-08 ENCOUNTER — Ambulatory Visit (HOSPITAL_COMMUNITY)
Admission: RE | Admit: 2020-07-08 | Discharge: 2020-07-08 | Disposition: A | Discharge status: Home / Self Care | Payer: 59 | Source: Ambulatory Visit | Attending: Nurse Practitioner | Admitting: Nurse Practitioner

## 2020-07-08 ENCOUNTER — Encounter (HOSPITAL_COMMUNITY): Payer: Self-pay | Admitting: Nurse Practitioner

## 2020-07-08 VITALS — BP 170/100 | HR 60 | Ht 74.0 in | Wt 237.8 lb

## 2020-07-08 DIAGNOSIS — Z7901 Long term (current) use of anticoagulants: Secondary | ICD-10-CM | POA: Diagnosis not present

## 2020-07-08 DIAGNOSIS — I480 Paroxysmal atrial fibrillation: Secondary | ICD-10-CM | POA: Diagnosis not present

## 2020-07-08 DIAGNOSIS — Z79899 Other long term (current) drug therapy: Secondary | ICD-10-CM | POA: Diagnosis not present

## 2020-07-08 MED ORDER — DILTIAZEM HCL 30 MG PO TABS: ORAL_TABLET | ORAL | 1 refills | Status: DC

## 2020-07-08 NOTE — Patient Instructions (Signed)
Stop Xarelto  Cardizem 30mg  -- take 1 tablet every 4 hours AS NEEDED for heart rate >100 as long as top number of blood pressure >100.

## 2020-07-08 NOTE — Progress Notes (Signed)
Primary Care Physician: [name], MD Referring Physician: [facility] ER f/u    Lavontae Cornia is a 54 y.o. male with no medical history that presented for an colonoscopy 10/29/21and found to be in afib. He was sent to the ER. He was sent home on metoprolol and asa for a CHA2DS2VASc score of 0. He has noted palpitations over the last 3 weeks. He is not terribly symptomatic with afib. He did not take his am BB this am and has a HR over 110. He has not tracked at home to see HR control and if he is persistent or paroxysmal. I discussed with him starting anticoagulation  to see if a CV may be in the future.   He denies any tobacco use, no alcohol, has stopped caffeine. He has been told by his wife that he snores and has had witnessed apnea. If he sleeps on his side he snores less. His father has afib as well. Denies a bleeding history.   He returns today, 07/08/20 and is in [place]. His zio patch showed 100% afib but he feels that he went back into SR in the last week,ekg shows SR today. He did note blood in stool one time. SInce no cardioversion is needed and his CHA2DS2VASc score of 0, then he can stop xarelto after the 3 tablets that he has left. I discussed I would stay on the metoprolol to ensure SR especially with him repeating colonoscopy in the near future. I will order an echo now that he is  in [place].   Today, he denies symptoms of palpitations, chest pain, shortness of breath, orthopnea, PND, lower extremity edema, dizziness, presyncope, syncope, or neurologic sequela. The patient is tolerating medications without difficulties and is otherwise without complaint today.   Past Medical History:  Diagnosis Date  . Arthritis    Past Surgical History:  Procedure Laterality Date  . APPENDECTOMY  2003  . FOREIGN BODY REMOVAL Right    right thing  . left knee meniscus  2010  . right knee meniscus  2013    Current Outpatient Medications  Medication Sig Dispense Refill  . Acetaminophen (TYLENOL PO)  Take 500 mg by mouth as needed.    . metoprolol tartrate (LOPRESSOR) 25 MG tablet Take 1 tablet (25 mg total) by mouth 2 (two) times daily. 60 tablet 3  . diltiazem (CARDIZEM) 30 MG tablet Take 1 tablet every 4 hours AS NEEDED for heart rate >100 45 tablet 1   Current Facility-Administered Medications  Medication Dose Route Frequency Provider Last Rate Last Admin  . 0.9 %  sodium chloride infusion  500 mL Intravenous Once Milus Banister, MD        No Known Allergies  Social History   Socioeconomic History  . Marital status: Married    Spouse name: Not on file  . Number of children: Not on file  . Years of education: Not on file  . Highest education level: Not on file  Occupational History  . Not on file  Tobacco Use  . Smoking status: Never Smoker  . Smokeless tobacco: Never Used  Vaping Use  . Vaping Use: Never used  Substance and Sexual Activity  . Alcohol use: Never  . Drug use: Never  . Sexual activity: Yes  Other Topics Concern  . Not on file  Social History Narrative   Married   Works in [place] Strain:   . Difficulty of Paying  Living Expenses: Not on file  Food Insecurity:   . Worried About Charity fundraiser in the Last Year: Not on file  . Ran Out of Food in the Last Year: Not on file  Transportation Needs:   . Lack of Transportation (Medical): Not on file  . Lack of Transportation (Non-Medical): Not on file  Physical Activity:   . Days of Exercise per Week: Not on file  . Minutes of Exercise per Session: Not on file  Stress:   . Feeling of Stress : Not on file  Social Connections:   . Frequency of Communication with Friends and Family: Not on file  . Frequency of Social Gatherings with Friends and Family: Not on file  . Attends Religious Services: Not on file  . Active Member of Clubs or Organizations: Not on file  . Attends Archivist Meetings: Not on file  . Marital Status: Not on  file  Intimate Partner Violence:   . Fear of Current or Ex-Partner: Not on file  . Emotionally Abused: Not on file  . Physically Abused: Not on file  . Sexually Abused: Not on file    Family History  Problem Relation Age of Onset  . Arthritis Mother   . Hypertension Mother   . Hypertension Father   . Colon cancer Neg Hx   . Colon polyps Neg Hx   . Esophageal cancer Neg Hx   . Rectal cancer Neg Hx   . Stomach cancer Neg Hx     ROS- All systems are reviewed and negative except as per the HPI above  Physical Exam: Vitals:   07/08/20 1426  BP: (!) 170/100  Pulse: 60  Weight: 107.9 kg  Height: 6\' 2"  (1.88 m)   Wt Readings from Last 3 Encounters:  07/08/20 107.9 kg  06/10/20 108.9 kg  06/05/20 104.3 kg    Labs: Lab Results  Component Value Date   NA 139 06/05/2020   K 4.0 06/05/2020   CL 105 06/05/2020   CO2 23 06/05/2020   GLUCOSE 99 06/05/2020   BUN 12 06/05/2020   CREATININE 1.03 06/05/2020   CALCIUM 9.7 06/05/2020   MG 2.1 06/05/2020   No results found for: INR No results found for: CHOL, HDL, LDLCALC, TRIG   GEN- The patient is well appearing, alert and oriented x 3 today.   Head- normocephalic, atraumatic Eyes-  Sclera clear, conjunctiva pink Ears- hearing intact Oropharynx- clear Neck- supple, no JVP Lymph- no cervical lymphadenopathy Lungs- Clear to ausculation bilaterally, normal work of breathing Heart- Regular rate and rhythm, no murmurs, rubs or gallops, PMI not laterally displaced GI- soft, NT, ND, + BS Extremities- no clubbing, cyanosis, or edema MS- no significant deformity or atrophy Skin- no rash or lesion Psych- euthymic mood, full affect Neuro- strength and sensation are intact  EKG- NSR, normal ekg at 60 bpm, pr int 176 ms, qrs int 86 ms, qtc 420 ms  Epic records reviewed    Assessment and Plan: 1.  New onset  Afib  Found in  the setting of an colonoscopy, but had felt palpitations for  prior 3 weeks General education re afib  and triggers Will now order  an echo as he is back in SR  He will continue metoprolol 25 mg bid, but if no afib after several months , can decrease to 12.5 mg bid and then stop after 2 weeks   BP is elevated today but at home his systolic is around 161 mg systolic  I will also rx 30 mg cardizem as needed for breakthrough afib    2. CHA2DS2VASc score of 0 He  can stop xarelto as CV is not needed with him being back in SR   I will call results of echo, if abnormal will refer to cardiology  [place]. Makenna Macaluso, [facility] [address] [place], [place] [address] [phone]

## 2020-07-15 ENCOUNTER — Ambulatory Visit (HOSPITAL_COMMUNITY): Payer: 59

## 2020-07-15 ENCOUNTER — Other Ambulatory Visit (HOSPITAL_COMMUNITY): Payer: Self-pay | Admitting: *Deleted

## 2020-07-15 DIAGNOSIS — I480 Paroxysmal atrial fibrillation: Secondary | ICD-10-CM

## 2020-07-22 ENCOUNTER — Ambulatory Visit (HOSPITAL_COMMUNITY): Payer: 59

## 2020-07-27 ENCOUNTER — Ambulatory Visit (HOSPITAL_COMMUNITY)
Admission: RE | Admit: 2020-07-27 | Discharge: 2020-07-27 | Disposition: A | Discharge status: Home / Self Care | Payer: 59 | Source: Ambulatory Visit | Attending: Nurse Practitioner | Admitting: Nurse Practitioner

## 2020-07-27 ENCOUNTER — Other Ambulatory Visit: Payer: Self-pay

## 2020-07-27 DIAGNOSIS — I480 Paroxysmal atrial fibrillation: Secondary | ICD-10-CM

## 2020-07-27 LAB — ECHOCARDIOGRAM COMPLETE
Area-P 1/2: 2.17 cm2
S' Lateral: 3.4 cm

## 2020-07-27 NOTE — Progress Notes (Signed)
  Echocardiogram 2D Echocardiogram with 3D has been performed.  Darlina Sicilian M 07/27/2020, 1:31 PM

## 2020-11-02 ENCOUNTER — Other Ambulatory Visit (HOSPITAL_COMMUNITY): Payer: Self-pay | Admitting: Nurse Practitioner

## 2020-11-24 ENCOUNTER — Telehealth: Payer: Self-pay | Admitting: Gastroenterology

## 2020-11-24 NOTE — Telephone Encounter (Signed)
Yes, he will need an office visit. Thank you

## 2020-11-24 NOTE — Telephone Encounter (Signed)
Inbound call from patient wanting to schedule colonoscopy.  Will he need an office visit first?  Please advise.

## 2020-11-25 NOTE — Telephone Encounter (Signed)
Patient scheduled for 11/27/20.

## 2020-11-27 ENCOUNTER — Encounter: Payer: Self-pay | Admitting: Physician Assistant

## 2020-11-27 ENCOUNTER — Ambulatory Visit (INDEPENDENT_AMBULATORY_CARE_PROVIDER_SITE_OTHER): Payer: 59 | Admitting: Physician Assistant

## 2020-11-27 ENCOUNTER — Other Ambulatory Visit: Payer: Self-pay

## 2020-11-27 VITALS — BP 126/86 | HR 67 | Ht 74.0 in | Wt 243.6 lb

## 2020-11-27 DIAGNOSIS — Z1211 Encounter for screening for malignant neoplasm of colon: Secondary | ICD-10-CM

## 2020-11-27 MED ORDER — NA SULFATE-K SULFATE-MG SULF 17.5-3.13-1.6 GM/177ML PO SOLN: 1.0000 | Freq: Once | ORAL | 0 refills | Status: AC

## 2020-11-27 NOTE — Patient Instructions (Addendum)
If you are age 57 or older, your body mass index should be between 23-30. Your Body mass index is 31.28 kg/m. If this is out of the aforementioned range listed, please consider follow up with your Primary Care Provider.  If you are age 82 or younger, your body mass index should be between 19-25. Your Body mass index is 31.28 kg/m. If this is out of the aformentioned range listed, please consider follow up with your Primary Care Provider.   You have been scheduled for a colonoscopy. Please follow written instructions given to you at your visit today.  Please pick up your prep supplies at the pharmacy within the next 1-3 days. If you use inhalers (even only as needed), please bring them with you on the day of your procedure.  Continue your Metoprolol until after Colonoscopy.  Thank you for entrusting me with your care and choosing [facility].  Amy Esterwood, PA-C

## 2020-11-27 NOTE — Progress Notes (Signed)
Subjective:    Patient ID: Shane Miles, male    DOB: August 17, 1965, 55 y.o.   MRN: [id number]  HPI Shane Miles is a pleasant 55 year old white male who comes in today to discuss Colonoscopy.  Patient is established with Dr. Ardis Hughs, and had been scheduled for screening colonoscopy on 05/31/2020.  When he presented for the colonoscopy he was found to be in atrial fibrillation with RVR.  Decision was made not to proceed with colonoscopy and he was referred to the emergency room for evaluation.  Patient had no prior history of atrial fibrillation that he was aware of, but had been feeling palpitations over the 3 weeks prior to the colonoscopy. He has since been evaluated by cardiology, had 2D echo done in December 2021 which showed an EF of 60 to 65%.  He was initially covered with Xarelto for about 1 month, and had been started on metoprolol.  When he was seen in December 2021 he was back in sinus rhythm.  Prior to that the ZIO monitor had shown 100% atrial fibrillation. He has since been taken off of Xarelto and dose of metoprolol is being reduced as of this week.  He was told to take the lower dose of metoprolol and then stop in 2 weeks if he remained asymptomatic. He feels that he has been maintaining sinus rhythm. He has no current GI complaints.  Review of Systems Pertinent positive and negative review of systems were noted in the above HPI section.  All other review of systems was otherwise negative.  Outpatient Encounter Medications as of 11/27/2020  Medication Sig  . Acetaminophen (TYLENOL PO) Take 500 mg by mouth as needed.  . metoprolol tartrate (LOPRESSOR) 25 MG tablet TAKE 1 TABLET BY MOUTH TWICE A DAY (Patient taking differently: 12.5 mg 2 (two) times daily.)  . Na Sulfate-K Sulfate-Mg Sulf 17.5-3.13-1.6 GM/177ML SOLN Take 1 kit by mouth once for 1 dose. Apply Coupon=BIN: [id number] PCN: CN GROUP: [id number] ID: [id number]; NO prior authorization  . [DISCONTINUED] diltiazem (CARDIZEM) 30 MG tablet  Take 1 tablet every 4 hours AS NEEDED for heart rate >100   Facility-Administered Encounter Medications as of 11/27/2020  Medication  . 0.9 %  sodium chloride infusion   No Known Allergies Patient Active Problem List   Diagnosis Date Noted  . Osteoarthritis 03/05/2020  . Impingement syndrome of right shoulder 02/13/2020  . Elevated blood pressure reading 02/03/2020   Social History   Socioeconomic History  . Marital status: Married    Spouse name: Not on file  . Number of children: Not on file  . Years of education: Not on file  . Highest education level: Not on file  Occupational History  . Not on file  Tobacco Use  . Smoking status: Never Smoker  . Smokeless tobacco: Never Used  Vaping Use  . Vaping Use: Never used  Substance and Sexual Activity  . Alcohol use: Never  . Drug use: Never  . Sexual activity: Yes  Other Topics Concern  . Not on file  Social History Narrative   Married   Works in [place] Strain: Not on file  Food Insecurity: Not on file  Transportation Needs: Not on file  Physical Activity: Not on file  Stress: Not on file  Social Connections: Not on file  Intimate Partner Violence: Not on file    [name] family history includes Arthritis in his mother; Hypertension in his father and mother.  Objective:    Vitals:   11/27/20 1342  BP: 126/86  Pulse: 67  SpO2: 97%    Physical Exam Well-developed well-nourished wm in no acute distress.  Height, Weight, 243BMI 31.28   HEENT; nontraumatic normocephalic, EOMI, PE R LA, sclera anicteric. Oropharynx; not examined Neck; supple, no JVD Cardiovascular; regular rate and rhythm with S1-S2, no murmur rub or gallop Pulmonary; Clear bilaterally Abdomen; soft, nontender, nondistended, no palpable mass or hepatosplenomegaly, bowel sounds are active Rectal; not done Skin; benign exam, no jaundice rash or appreciable lesions Extremities;  no clubbing cyanosis or edema skin warm and dry Neuro/Psych; alert and oriented x4, grossly nonfocal mood and affect appropriate       Assessment & Plan:   #44 55 year old white male initially referred for screening colonoscopy Shane Miles presents today to reschedule colonoscopy .  No GI complaints, average risk  #2 patient was found to be in atrial fibrillation with RVR when he presented for colonoscopy on 05/31/2020 and procedure was therefore canceled and he was referred to the emergency room for evaluation.  He has now been back in sinus rhythm over the past couple of months, is being managed with metoprolol.  Briefly treated with Xarelto which has been discontinued He has been instructed by cardiology to decrease dose of metoprolol as of this week to 12.5 mg twice daily, then okay to stop in 2 weeks if remains asymptomatic.   Plan; patient will be rescheduled for colonoscopy with Dr. Ardis Hughs.  Procedure was discussed in detail with patient including indications risk and benefits and he is agreeable to proceed  Advised he may want to continue low-dose metoprolol 12.5 mg twice daily until post colonoscopy, then plan to discontinue    Alfredia Ferguson PA-C 11/27/2020   Cc: [name], MD

## 2020-11-30 NOTE — Progress Notes (Signed)
I agree with the above note, plan 

## 2021-02-01 ENCOUNTER — Telehealth: Payer: Self-pay | Admitting: Gastroenterology

## 2021-02-01 NOTE — Telephone Encounter (Signed)
Patient states Pharmacy called and stated that the coupon for suprep has expires, Please fax over new coupon to pharmacy or call.  Patient has procedure on 02-05-21

## 2021-02-01 NOTE — Telephone Encounter (Signed)
Called patient and advised him that Suprep no longer has any coupons that we can use.

## 2021-02-05 ENCOUNTER — Encounter: Payer: Self-pay | Admitting: Gastroenterology

## 2021-02-05 ENCOUNTER — Ambulatory Visit (AMBULATORY_SURGERY_CENTER): Payer: 59 | Admitting: Gastroenterology

## 2021-02-05 ENCOUNTER — Other Ambulatory Visit: Payer: Self-pay

## 2021-02-05 VITALS — BP 128/84 | HR 51 | Temp 98.4°F | Resp 19 | Ht 74.0 in | Wt 243.0 lb

## 2021-02-05 DIAGNOSIS — D122 Benign neoplasm of ascending colon: Secondary | ICD-10-CM

## 2021-02-05 DIAGNOSIS — Z1211 Encounter for screening for malignant neoplasm of colon: Secondary | ICD-10-CM | POA: Diagnosis present

## 2021-02-05 MED ORDER — SODIUM CHLORIDE 0.9 % IV SOLN: 500.0000 mL | Freq: Once | INTRAVENOUS | Status: DC

## 2021-02-05 NOTE — Progress Notes (Signed)
Report to PACU, RN, vss, BBS= Clear.  

## 2021-02-05 NOTE — Progress Notes (Signed)
Medical history reviewed with no changes noted. VS assessed by C.W 

## 2021-02-05 NOTE — Patient Instructions (Signed)
YOU HAD AN ENDOSCOPIC PROCEDURE TODAY AT [facility]:   Refer to the procedure report that was given to you for any specific questions about what was found during the examination.  If the procedure report does not answer your questions, please call your gastroenterologist to clarify.  If you requested that your care partner not be given the details of your procedure findings, then the procedure report has been included in a sealed envelope for you to review at your convenience later.  YOU SHOULD EXPECT: Some feelings of bloating in the abdomen. Passage of more gas than usual.  Walking can help get rid of the air that was put into your GI tract during the procedure and reduce the bloating. If you had a lower endoscopy (such as a colonoscopy or flexible sigmoidoscopy) you may notice spotting of blood in your stool or on the toilet paper. If you underwent a bowel prep for your procedure, you may not have a normal bowel movement for a few days.  Please Note:  You might notice some irritation and congestion in your nose or some drainage.  This is from the oxygen used during your procedure.  There is no need for concern and it should clear up in a day or so.  SYMPTOMS TO REPORT IMMEDIATELY:  Following lower endoscopy (colonoscopy or flexible sigmoidoscopy):  Excessive amounts of blood in the stool  Significant tenderness or worsening of abdominal pains  Swelling of the abdomen that is new, acute  Fever of 100F or higher    For urgent or emergent issues, a gastroenterologist can be reached at any hour by calling [phone]. Do not use MyChart messaging for urgent concerns.    DIET:  We do recommend a small meal at first, but then you may proceed to your regular diet.  Drink plenty of fluids but you should avoid alcoholic beverages for 24 hours.  ACTIVITY:  You should plan to take it easy for the rest of today and you should NOT DRIVE or use heavy machinery until tomorrow (because  of the sedation medicines used during the test).    FOLLOW UP: Our staff will call the number listed on your records 48-72 hours following your procedure to check on you and address any questions or concerns that you may have regarding the information given to you following your procedure. If we do not reach you, we will leave a message.  We will attempt to reach you two times.  During this call, we will ask if you have developed any symptoms of COVID 19. If you develop any symptoms (ie: fever, flu-like symptoms, shortness of breath, cough etc.) before then, please call [phone].  If you test positive for Covid 19 in the 2 weeks post procedure, please call and report this information to [place].    If any biopsies were taken you will be contacted by phone or by letter within the next 1-3 weeks.  Please call us at [phone] if you have not heard about the biopsies in 3 weeks.    SIGNATURES/CONFIDENTIALITY: You and/or your care partner have signed paperwork which will be entered into your electronic medical record.  These signatures attest to the fact that that the information above on your After Visit Summary has been reviewed and is understood.  Full responsibility of the confidentiality of this discharge information lies with you and/or your care-partner.    Resume medications. Information given on polyps and hemorrhoids.

## 2021-02-05 NOTE — Op Note (Signed)
[facility] Patient Name: Shane Miles Procedure Date: 02/05/2021 9:33 AM MRN: [id number] Endoscopist: Milus Banister , MD Age: 55 Referring MD:  Date of Birth: 25-May-1966 Gender: Male Account #: [id number] Procedure:                Colonoscopy Indications:              Screening for colorectal malignant neoplasm Medicines:                Monitored Anesthesia Care Procedure:                Pre-Anesthesia Assessment:                           - Prior to the procedure, a History and Physical                            was performed, and patient medications and                            allergies were reviewed. The patient's tolerance of                            previous anesthesia was also reviewed. The risks                            and benefits of the procedure and the sedation                            options and risks were discussed with the patient.                            All questions were answered, and informed consent                            was obtained. Prior Anticoagulants: The patient has                            taken no previous anticoagulant or antiplatelet                            agents. ASA Grade Assessment: II - A patient with                            mild systemic disease. After reviewing the risks                            and benefits, the patient was deemed in                            satisfactory condition to undergo the procedure.                           After obtaining informed consent, the colonoscope  was passed under direct vision. Throughout the                            procedure, the patient's blood pressure, pulse, and                            oxygen saturations were monitored continuously. The                            Colonoscope was introduced through the anus and                            advanced to the the cecum, identified by                            appendiceal orifice and  ileocecal valve. The                            colonoscopy was performed without difficulty. The                            patient tolerated the procedure well. The quality                            of the bowel preparation was good. The ileocecal                            valve, appendiceal orifice, and rectum were                            photographed. Scope In: 9:42:04 AM Scope Out: 9:50:08 AM Scope Withdrawal Time: 0 hours 6 minutes 14 seconds  Total Procedure Duration: 0 hours 8 minutes 4 seconds  Findings:                 A 13 mm polyp was found in the ascending colon. The                            polyp was semi-pedunculated. The polyp was removed                            with a hot snare. Resection and retrieval were                            complete.                           Multiple small and large-mouthed diverticula were                            found in the entire colon.                           Internal hemorrhoids were found. The hemorrhoids  were small.                           The exam was otherwise without abnormality on                            direct and retroflexion views. Complications:            No immediate complications. Estimated blood loss:                            None. Estimated Blood Loss:     Estimated blood loss: none. Impression:               - One 13 mm polyp in the ascending colon, removed                            with a hot snare. Resected and retrieved.                           - Diverticulosis in the entire examined colon.                           - Internal hemorrhoids.                           - The examination was otherwise normal on direct                            and retroflexion views. Recommendation:           - Patient has a contact number available for                            emergencies. The signs and symptoms of potential                            delayed complications were  discussed with the                            patient. Return to normal activities tomorrow.                            Written discharge instructions were provided to the                            patient.                           - Resume previous diet.                           - Continue present medications.                           - Await pathology results. Milus Banister, MD 02/05/2021 9:53:52 AM This report has been signed electronically.

## 2021-02-05 NOTE — Progress Notes (Signed)
Called to room to assist during endoscopic procedure.  Patient ID and intended procedure confirmed with present staff. Received instructions for my participation in the procedure from the performing physician.  

## 2021-02-10 ENCOUNTER — Telehealth: Payer: Self-pay

## 2021-02-10 NOTE — Telephone Encounter (Signed)
  Follow up Call-  Call back number 02/05/2021 06/05/2020  Post procedure Call Back phone  # [phone]  Permission to leave phone message Yes Yes     Patient questions:  Do you have a fever, pain , or abdominal swelling? No. Pain Score  0 *  Have you tolerated food without any problems? Yes.    Have you been able to return to your normal activities? Yes.    Do you have any questions about your discharge instructions: Diet   No. Medications  No. Follow up visit  No.  Do you have questions or concerns about your Care? No.  Actions: * If pain score is 4 or above: No action needed, pain <4.  Have you developed a fever since your procedure? no  2.   Have you had an respiratory symptoms (SOB or cough) since your procedure? no  3.   Have you tested positive for COVID 19 since your procedure no  4.   Have you had any family members/close contacts diagnosed with the COVID 19 since your procedure?  no   If yes to any of these questions please route to Joylene John, RN and Joella Prince, RN

## 2021-02-15 ENCOUNTER — Encounter: Payer: Self-pay | Admitting: Gastroenterology

## 2021-03-03 ENCOUNTER — Encounter: Payer: Self-pay | Admitting: Urology

## 2021-03-03 ENCOUNTER — Other Ambulatory Visit: Payer: Self-pay

## 2021-03-03 ENCOUNTER — Ambulatory Visit (INDEPENDENT_AMBULATORY_CARE_PROVIDER_SITE_OTHER): Payer: 59 | Admitting: Urology

## 2021-03-03 VITALS — BP 165/95 | HR 61 | Ht 74.0 in | Wt 240.5 lb

## 2021-03-03 DIAGNOSIS — N50812 Left testicular pain: Secondary | ICD-10-CM | POA: Diagnosis not present

## 2021-03-03 DIAGNOSIS — R351 Nocturia: Secondary | ICD-10-CM | POA: Insufficient documentation

## 2021-03-03 DIAGNOSIS — N400 Enlarged prostate without lower urinary tract symptoms: Secondary | ICD-10-CM

## 2021-03-03 DIAGNOSIS — N50811 Right testicular pain: Secondary | ICD-10-CM | POA: Insufficient documentation

## 2021-03-03 LAB — URINALYSIS, ROUTINE W REFLEX MICROSCOPIC
Bilirubin, UA: NEGATIVE
Glucose, UA: NEGATIVE
Ketones, UA: NEGATIVE
Leukocytes,UA: NEGATIVE
Nitrite, UA: NEGATIVE
Protein,UA: NEGATIVE
Specific Gravity, UA: 1.025 (ref 1.005–1.030)
Urobilinogen, Ur: 0.2 mg/dL (ref 0.2–1.0)
pH, UA: 6 (ref 5.0–7.5)

## 2021-03-03 LAB — MICROSCOPIC EXAMINATION
Bacteria, UA: NONE SEEN
Epithelial Cells (non renal): NONE SEEN /hpf (ref 0–10)
RBC, Urine: NONE SEEN /hpf (ref 0–2)
Renal Epithel, UA: NONE SEEN /hpf
WBC, UA: NONE SEEN /hpf (ref 0–5)

## 2021-03-03 LAB — BLADDER SCAN AMB NON-IMAGING: Scan Result: 1

## 2021-03-03 MED ORDER — ALFUZOSIN HCL ER 10 MG PO TB24: 10.0000 mg | ORAL_TABLET | Freq: Every day | ORAL | 11 refills | Status: DC

## 2021-03-03 NOTE — Progress Notes (Signed)
03/03/2021 3:44 PM   Shane Miles [birth year] [id number]  Referring provider: [name], MD [address] [place],  [place] [address]  Nocturia and testicular pain  HPI: Shane Miles is a 55yo here for evaluation for nocturia and bilateral testicular pain. For the past several years he has noted worsening LUTS. Nocturia 3-4x, weak urinary stream, urinary hesitancy, starting/stopping urinary stream. IPSS 8 QOL 4. No dysuria or hematuria. No prior BPH therapy. He has associated dull bilateral testicular pain. No hx of UTI, prostatitis, or epididymo-orchitis. He has a known spermatocele on the right.    PMH: Past Medical History:  Diagnosis Date   Arthritis     Surgical History: Past Surgical History:  Procedure Laterality Date   APPENDECTOMY  2003   FOREIGN BODY REMOVAL Right    right thing   left knee meniscus  2010   right knee meniscus  2013    Home Medications:  Allergies as of 03/03/2021   No Known Allergies      Medication List        Accurate as of March 03, 2021  3:44 PM. If you have any questions, ask your nurse or doctor.          metoprolol tartrate 25 MG tablet Commonly known as: LOPRESSOR TAKE 1 TABLET BY MOUTH TWICE A DAY What changed:  how much to take how to take this   TYLENOL PO Take 500 mg by mouth as needed.        Allergies: No Known Allergies  Family History: Family History  Problem Relation Age of Onset   Arthritis Mother    Hypertension Mother    Hypertension Father    Colon cancer Neg Hx    Colon polyps Neg Hx    Esophageal cancer Neg Hx    Rectal cancer Neg Hx    Stomach cancer Neg Hx     Social History:  reports that he has never smoked. He has never used smokeless tobacco. He reports that he does not drink alcohol and does not use drugs.  ROS: All other review of systems were reviewed and are negative except what is noted above in HPI  Physical Exam: BP (!) 165/95   Pulse 61   Ht '6\' 2"'$  (1.88 m)   Wt 240 lb 8  oz (109.1 kg)   BMI 30.88 kg/m   Constitutional:  Alert and oriented, No acute distress. HEENT: [place] AT, moist mucus membranes.  Trachea midline, no masses. Cardiovascular: No clubbing, cyanosis, or edema. Respiratory: Normal respiratory effort, no increased work of breathing. GI: Abdomen is soft, nontender, nondistended, no abdominal masses GU: No CVA tenderness. Circumcised phallus. 2 penile 3-90m sebaceous cysts. No masses/lesions on penis, testis, scrotum. Right 1cm epididymal cyst. Prostate 30g smooth no nodules no induration.  Lymph: No cervical or inguinal lymphadenopathy. Skin: No rashes, bruises or suspicious lesions. Neurologic: Grossly intact, no focal deficits, moving all 4 extremities. Psychiatric: Normal mood and affect.  Laboratory Data: Lab Results  Component Value Date   WBC 7.2 06/05/2020   HGB 16.1 06/05/2020   HCT 46.2 06/05/2020   MCV 89.7 06/05/2020   PLT 244 06/05/2020    Lab Results  Component Value Date   CREATININE 1.03 06/05/2020    Lab Results  Component Value Date   PSA 1.34 02/03/2020    No results found for: TESTOSTERONE  No results found for: HGBA1C  Urinalysis    Component Value Date/Time   COLORURINE YELLOW 02/03/2020 1419   APPEARANCEUR  CLEAR 02/03/2020 1419   LABSPEC 1.020 02/03/2020 1419   PHURINE 6.5 02/03/2020 1419   GLUCOSEU NEGATIVE 02/03/2020 1419   HGBUR NEGATIVE 02/03/2020 1419   BILIRUBINUR NEGATIVE 02/03/2020 1419   KETONESUR NEGATIVE 02/03/2020 1419   UROBILINOGEN 0.2 02/03/2020 1419   NITRITE NEGATIVE 02/03/2020 1419   LEUKOCYTESUR NEGATIVE 02/03/2020 1419    Lab Results  Component Value Date   MUCUS Presence of (A) 02/03/2020    Pertinent Imaging:  No results found for this or any previous visit.  No results found for this or any previous visit.  No results found for this or any previous visit.  No results found for this or any previous visit.  No results found for this or any previous visit.  No  results found for this or any previous visit.  No results found for this or any previous visit.  No results found for this or any previous visit.   Assessment & Plan:    1. Benign prostatic hyperplasia, unspecified whether lower urinary tract symptoms present -We will start uroxatral '10mg'$  qhs - Urinalysis, Routine w reflex microscopic - BLADDER SCAN AMB NON-IMAGING  2. Pain in both testicles -tyelonol/motrin prn  3. Nocturia -Uroxatral '10mg'$  qhs   No follow-ups on file.  Nicolette Bang, MD  [facility] [place]

## 2021-03-03 NOTE — Patient Instructions (Signed)
Benign Prostatic Hyperplasia  Benign prostatic hyperplasia (BPH) is an enlarged prostate gland that is caused by the normal aging process and not by cancer. The prostate is a walnut-sized gland that is involved in the production of semen. It is located in front of the rectum and below the bladder. The bladder stores urine and the urethra is the tube that carries the urine out of the body. The prostate may get bigger asa man gets older. An enlarged prostate can press on the urethra. This can make it harder to pass urine. The build-up of urine in the bladder can cause infection. Back pressure and infection may progress to bladder damage and kidney (renal) failure. What are the causes? This condition is part of a normal aging process. However, not all men develop problems from this condition. If the prostate enlarges away from the urethra, urine flow will not be blocked. If it enlarges toward the urethra andcompresses it, there will be problems passing urine. What increases the risk? This condition is more likely to develop in men over the age of 50 years. What are the signs or symptoms? Symptoms of this condition include: Getting up often during the night to urinate. Needing to urinate frequently during the day. Difficulty starting urine flow. Decrease in size and strength of your urine stream. Leaking (dribbling) after urinating. Inability to pass urine. This needs immediate treatment. Inability to completely empty your bladder. Pain when you pass urine. This is more common if there is also an infection. Urinary tract infection (UTI). How is this diagnosed? This condition is diagnosed based on your medical history, a physical exam, and your symptoms. Tests will also be done, such as: A post-void bladder scan. This measures any amount of urine that may remain in your bladder after you finish urinating. A digital rectal exam. In a rectal exam, your health care provider checks your prostate by  putting a lubricated, gloved finger into your rectum to feel the back of your prostate gland. This exam detects the size of your gland and any abnormal lumps or growths. An exam of your urine (urinalysis). A prostate specific antigen (PSA) screening. This is a blood test used to screen for prostate cancer. An ultrasound. This test uses sound waves to electronically produce a picture of your prostate gland. Your health care provider may refer you to a specialist in kidney and prostate diseases (urologist). How is this treated? Once symptoms begin, your health care provider will monitor your condition (active surveillance or watchful waiting). Treatment for this condition will depend on the severity of your condition. Treatment may include: Observation and yearly exams. This may be the only treatment needed if your condition and symptoms are mild. Medicines to relieve your symptoms, including: Medicines to shrink the prostate. Medicines to relax the muscle of the prostate. Surgery in severe cases. Surgery may include: Prostatectomy. In this procedure, the prostate tissue is removed completely through an open incision or with a laparoscope or robotics. Transurethral resection of the prostate (TURP). In this procedure, a tool is inserted through the opening at the tip of the penis (urethra). It is used to cut away tissue of the inner core of the prostate. The pieces are removed through the same opening of the penis. This removes the blockage. Transurethral incision (TUIP). In this procedure, small cuts are made in the prostate. This lessens the prostate's pressure on the urethra. Transurethral microwave thermotherapy (TUMT). This procedure uses microwaves to create heat. The heat destroys and removes a small   amount of prostate tissue. Transurethral needle ablation (TUNA). This procedure uses radio frequencies to destroy and remove a small amount of prostate tissue. Interstitial laser coagulation (ILC).  This procedure uses a laser to destroy and remove a small amount of prostate tissue. Transurethral electrovaporization (TUVP). This procedure uses electrodes to destroy and remove a small amount of prostate tissue. Prostatic urethral lift. This procedure inserts an implant to push the lobes of the prostate away from the urethra. Follow these instructions at home: Take over-the-counter and prescription medicines only as told by your health care provider. Monitor your symptoms for any changes. Contact your health care provider with any changes. Avoid drinking large amounts of liquid before going to bed or out in public. Avoid or reduce how much caffeine or alcohol you drink. Give yourself time when you urinate. Keep all follow-up visits as told by your health care provider. This is important. Contact a health care provider if: You have unexplained back pain. Your symptoms do not get better with treatment. You develop side effects from the medicine you are taking. Your urine becomes very dark or has a bad smell. Your lower abdomen becomes distended and you have trouble passing your urine. Get help right away if: You have a fever or chills. You suddenly cannot urinate. You feel lightheaded, or very dizzy, or you faint. There are large amounts of blood or clots in the urine. Your urinary problems become hard to manage. You develop moderate to severe low back or flank pain. The flank is the side of your body between the ribs and the hip. These symptoms may represent a serious problem that is an emergency. Do not wait to see if the symptoms will go away. Get medical help right away. Call your local emergency services (911 in the [place] Do not drive yourself to the hospital. Summary Benign prostatic hyperplasia (BPH) is an enlarged prostate that is caused by the normal aging process and not by cancer. An enlarged prostate can press on the urethra. This can make it hard to pass urine. This  condition is part of a normal aging process and is more likely to develop in men over the age of 50 years. Get help right away if you suddenly cannot urinate. This information is not intended to replace advice given to you by your health care provider. Make sure you discuss any questions you have with your healthcare provider. Document Revised: 04/02/2020 Document Reviewed: 04/02/2020 Elsevier Patient Education  2022 Elsevier Inc.  

## 2021-03-03 NOTE — Progress Notes (Signed)
post void residual=1 

## 2021-03-03 NOTE — Progress Notes (Signed)
Urological Symptom Review  Patient is experiencing the following symptoms: Frequent urination Get up at night to urinate Stream starts and stops Trouble starting stream Weak stream   Review of Systems  Gastrointestinal (upper)  : Negative for upper GI symptoms  Gastrointestinal (lower) : Negative for lower GI symptoms  Constitutional : Negative for symptoms  Skin: Negative for skin symptoms  Eyes: Negative for eye symptoms  Ear/Nose/Throat : Negative for Ear/Nose/Throat symptoms  Hematologic/Lymphatic: Negative for Hematologic/Lymphatic symptoms  Cardiovascular : Negative for cardiovascular symptoms  Respiratory : Negative for respiratory symptoms  Endocrine: Negative for endocrine symptoms  Musculoskeletal: Joint pain  Neurological: Negative for neurological symptoms  Psychologic: Negative for psychiatric symptoms

## 2021-03-08 ENCOUNTER — Telehealth (HOSPITAL_COMMUNITY): Payer: Self-pay

## 2021-03-08 NOTE — Telephone Encounter (Signed)
Left message for patient to call back  

## 2021-04-13 ENCOUNTER — Encounter: Payer: Self-pay | Admitting: Urology

## 2021-04-13 ENCOUNTER — Other Ambulatory Visit: Payer: Self-pay

## 2021-04-13 ENCOUNTER — Telehealth (INDEPENDENT_AMBULATORY_CARE_PROVIDER_SITE_OTHER): Payer: 59 | Admitting: Urology

## 2021-04-13 DIAGNOSIS — R351 Nocturia: Secondary | ICD-10-CM

## 2021-04-13 DIAGNOSIS — N50811 Right testicular pain: Secondary | ICD-10-CM

## 2021-04-13 DIAGNOSIS — N50812 Left testicular pain: Secondary | ICD-10-CM | POA: Diagnosis not present

## 2021-04-13 DIAGNOSIS — N400 Enlarged prostate without lower urinary tract symptoms: Secondary | ICD-10-CM | POA: Diagnosis not present

## 2021-04-13 NOTE — Patient Instructions (Signed)
Benign Prostatic Hyperplasia Benign prostatic hyperplasia (BPH) is an enlarged prostate gland that is caused by the normal aging process and not by cancer. The prostate is a walnut-sized gland that is involved in the production of semen. It is located in front of the rectum and below the bladder. The bladder stores urine and the urethra is the tube that carries the urine out of the body. The prostate may get bigger as a man gets older. An enlarged prostate can press on the urethra. This can make it harder to pass urine. The build-up of urine in the bladder can cause infection. Back pressure and infection may progress to bladder damage and kidney (renal) failure. What are the causes? This condition is part of a normal aging process. However, not all men develop problems from this condition. If the prostate enlarges away from the urethra, urine flow will not be blocked. If it enlarges toward the urethra and compresses it, there will be problems passing urine. What increases the risk? This condition is more likely to develop in men over the age of 50 years. What are the signs or symptoms? Symptoms of this condition include: Getting up often during the night to urinate. Needing to urinate frequently during the day. Difficulty starting urine flow. Decrease in size and strength of your urine stream. Leaking (dribbling) after urinating. Inability to pass urine. This needs immediate treatment. Inability to completely empty your bladder. Pain when you pass urine. This is more common if there is also an infection. Urinary tract infection (UTI). How is this diagnosed? This condition is diagnosed based on your medical history, a physical exam, and your symptoms. Tests will also be done, such as: A post-void bladder scan. This measures any amount of urine that may remain in your bladder after you finish urinating. A digital rectal exam. In a rectal exam, your health care provider checks your prostate by  putting a lubricated, gloved finger into your rectum to feel the back of your prostate gland. This exam detects the size of your gland and any abnormal lumps or growths. An exam of your urine (urinalysis). A prostate specific antigen (PSA) screening. This is a blood test used to screen for prostate cancer. An ultrasound. This test uses sound waves to electronically produce a picture of your prostate gland. Your health care provider may refer you to a specialist in kidney and prostate diseases (urologist). How is this treated? Once symptoms begin, your health care provider will monitor your condition (active surveillance or watchful waiting). Treatment for this condition will depend on the severity of your condition. Treatment may include: Observation and yearly exams. This may be the only treatment needed if your condition and symptoms are mild. Medicines to relieve your symptoms, including: Medicines to shrink the prostate. Medicines to relax the muscle of the prostate. Surgery in severe cases. Surgery may include: Prostatectomy. In this procedure, the prostate tissue is removed completely through an open incision or with a laparoscope or robotics. Transurethral resection of the prostate (TURP). In this procedure, a tool is inserted through the opening at the tip of the penis (urethra). It is used to cut away tissue of the inner core of the prostate. The pieces are removed through the same opening of the penis. This removes the blockage. Transurethral incision (TUIP). In this procedure, small cuts are made in the prostate. This lessens the prostate's pressure on the urethra. Transurethral microwave thermotherapy (TUMT). This procedure uses microwaves to create heat. The heat destroys and removes a   small amount of prostate tissue. Transurethral needle ablation (TUNA). This procedure uses radio frequencies to destroy and remove a small amount of prostate tissue. Interstitial laser coagulation (ILC).  This procedure uses a laser to destroy and remove a small amount of prostate tissue. Transurethral electrovaporization (TUVP). This procedure uses electrodes to destroy and remove a small amount of prostate tissue. Prostatic urethral lift. This procedure inserts an implant to push the lobes of the prostate away from the urethra. Follow these instructions at home: Take over-the-counter and prescription medicines only as told by your health care provider. Monitor your symptoms for any changes. Contact your health care provider with any changes. Avoid drinking large amounts of liquid before going to bed or out in public. Avoid or reduce how much caffeine or alcohol you drink. Give yourself time when you urinate. Keep all follow-up visits as told by your health care provider. This is important. Contact a health care provider if: You have unexplained back pain. Your symptoms do not get better with treatment. You develop side effects from the medicine you are taking. Your urine becomes very dark or has a bad smell. Your lower abdomen becomes distended and you have trouble passing your urine. Get help right away if: You have a fever or chills. You suddenly cannot urinate. You feel lightheaded, or very dizzy, or you faint. There are large amounts of blood or clots in the urine. Your urinary problems become hard to manage. You develop moderate to severe low back or flank pain. The flank is the side of your body between the ribs and the hip. These symptoms may represent a serious problem that is an emergency. Do not wait to see if the symptoms will go away. Get medical help right away. Call your local emergency services (911 in the [place] Do not drive yourself to the hospital. Summary Benign prostatic hyperplasia (BPH) is an enlarged prostate that is caused by the normal aging process and not by cancer. An enlarged prostate can press on the urethra. This can make it hard to pass urine. This  condition is part of a normal aging process and is more likely to develop in men over the age of 50 years. Get help right away if you suddenly cannot urinate. This information is not intended to replace advice given to you by your health care provider. Make sure you discuss any questions you have with your health care provider. Document Revised: 11/04/2020 Document Reviewed: 04/02/2020 Elsevier Patient Education  2022 Elsevier Inc.  

## 2021-04-13 NOTE — Progress Notes (Signed)
04/13/2021 3:48 PM   Shane Miles May 14, 1966 [id number]  Referring provider: [name], [place],  [place] [address]  Patient location: home Physician location: office I connected with  Darrett Pitcher on 04/13/21 by a video enabled telemedicine application and verified that I am speaking with the correct person using two identifiers.   I discussed the limitations of evaluation and management by telemedicine. The patient expressed understanding and agreed to proceed.    Followup testicular pain and BPH  HPI: Shane Miles is a 55yo here for followup for testicular pain and BPH. He is currently on uroxatral '10mg'$  qhs. His nocturia was not improved with uroxatral. He has nocturia 3-4x. His urine stream is improved. Urinary frequency and urgency resolved. His bilateral testicular pain resolved since starting uroxatral '10mg'$  qhs. No other complaints today   PMH: Past Medical History:  Diagnosis Date   Arthritis     Surgical History: Past Surgical History:  Procedure Laterality Date   APPENDECTOMY  2003   FOREIGN BODY REMOVAL Right    right thing   left knee meniscus  2010   right knee meniscus  2013    Home Medications:  Allergies as of 04/13/2021   No Known Allergies      Medication List        Accurate as of April 13, 2021  3:48 PM. If you have any questions, ask your nurse or doctor.          alfuzosin 10 MG 24 hr tablet Commonly known as: UROXATRAL Take 1 tablet (10 mg total) by mouth at bedtime.   metoprolol tartrate 25 MG tablet Commonly known as: LOPRESSOR TAKE 1 TABLET BY MOUTH TWICE A DAY What changed:  how much to take how to take this   TYLENOL PO Take 500 mg by mouth as needed.        Allergies: No Known Allergies  Family History: Family History  Problem Relation Age of Onset   Arthritis Mother    Hypertension Mother    Hypertension Father    Colon cancer Neg Hx    Colon polyps Neg Hx    Esophageal cancer Neg Hx     Rectal cancer Neg Hx    Stomach cancer Neg Hx     Social History:  reports that he has never smoked. He has never used smokeless tobacco. He reports that he does not drink alcohol and does not use drugs.  ROS: All other review of systems were reviewed and are negative except what is noted above in HPI   Laboratory Data: Lab Results  Component Value Date   WBC 7.2 06/05/2020   HGB 16.1 06/05/2020   HCT 46.2 06/05/2020   MCV 89.7 06/05/2020   PLT 244 06/05/2020    Lab Results  Component Value Date   CREATININE 1.03 06/05/2020    Lab Results  Component Value Date   PSA 1.34 02/03/2020    No results found for: TESTOSTERONE  No results found for: HGBA1C  Urinalysis    Component Value Date/Time   COLORURINE YELLOW 02/03/2020 1419   APPEARANCEUR Clear 03/03/2021 1543   LABSPEC 1.020 02/03/2020 1419   PHURINE 6.5 02/03/2020 1419   GLUCOSEU Negative 03/03/2021 1543   GLUCOSEU NEGATIVE 02/03/2020 1419   HGBUR NEGATIVE 02/03/2020 1419   BILIRUBINUR Negative 03/03/2021 1543   KETONESUR NEGATIVE 02/03/2020 1419   PROTEINUR Negative 03/03/2021 1543   UROBILINOGEN 0.2 02/03/2020 1419   NITRITE Negative 03/03/2021 1543   NITRITE NEGATIVE  02/03/2020 1419   LEUKOCYTESUR Negative 03/03/2021 1543   LEUKOCYTESUR NEGATIVE 02/03/2020 1419    Lab Results  Component Value Date   LABMICR See below: 03/03/2021   WBCUA None seen 03/03/2021   LABEPIT None seen 03/03/2021   MUCUS Presence of (A) 02/03/2020   BACTERIA None seen 03/03/2021    Pertinent Imaging:  No results found for this or any previous visit.  No results found for this or any previous visit.  No results found for this or any previous visit.  No results found for this or any previous visit.  No results found for this or any previous visit.  No results found for this or any previous visit.  No results found for this or any previous visit.  No results found for this or any previous visit.   Assessment  & Plan:    1. Benign prostatic hyperplasia, unspecified whether lower urinary tract symptoms present -continue uroxatral '10mg'$  qhs  2. Pain in both testicles -resolved  3. Nocturia -continue uroxatral -decrease fluid consumption within 2 hours of going to bed   No follow-ups on file.  Nicolette Bang, MD  [facility] [place]

## 2021-04-24 ENCOUNTER — Other Ambulatory Visit: Payer: Self-pay | Admitting: Urology

## 2021-05-03 ENCOUNTER — Telehealth (HOSPITAL_COMMUNITY): Payer: Self-pay | Admitting: *Deleted

## 2021-05-03 MED ORDER — METOPROLOL TARTRATE 25 MG PO TABS: 25.0000 mg | ORAL_TABLET | Freq: Two times a day (BID) | ORAL | 3 refills | Status: DC

## 2021-05-03 MED ORDER — RIVAROXABAN 20 MG PO TABS: 20.0000 mg | ORAL_TABLET | Freq: Every day | ORAL | 3 refills | Status: DC

## 2021-05-03 NOTE — Telephone Encounter (Signed)
Patient called in stating he went back into AFib Saturday mid-morning HRs 90-120 he took a few doses of cardizem but wasn't sure how many doses he should take. He did confirm by ekg on apple watch of the afib. Discussed with Roderic Palau NP will resume metoprolol 25mg  BID and Xarelto 20mg  once a day. Follow up scheduled. Pt in agreement.

## 2021-05-04 ENCOUNTER — Ambulatory Visit (HOSPITAL_COMMUNITY)
Admission: RE | Admit: 2021-05-04 | Discharge: 2021-05-04 | Disposition: A | Discharge status: Home / Self Care | Payer: 59 | Source: Ambulatory Visit | Attending: Nurse Practitioner | Admitting: Nurse Practitioner

## 2021-05-04 ENCOUNTER — Other Ambulatory Visit: Payer: Self-pay

## 2021-05-04 VITALS — BP 136/86 | HR 109 | Ht 74.0 in | Wt 247.2 lb

## 2021-05-04 DIAGNOSIS — Z79899 Other long term (current) drug therapy: Secondary | ICD-10-CM | POA: Insufficient documentation

## 2021-05-04 DIAGNOSIS — D6869 Other thrombophilia: Secondary | ICD-10-CM

## 2021-05-04 DIAGNOSIS — I480 Paroxysmal atrial fibrillation: Secondary | ICD-10-CM | POA: Diagnosis not present

## 2021-05-04 DIAGNOSIS — I4819 Other persistent atrial fibrillation: Secondary | ICD-10-CM | POA: Diagnosis not present

## 2021-05-04 DIAGNOSIS — Z7901 Long term (current) use of anticoagulants: Secondary | ICD-10-CM | POA: Diagnosis not present

## 2021-05-04 DIAGNOSIS — Z8249 Family history of ischemic heart disease and other diseases of the circulatory system: Secondary | ICD-10-CM | POA: Diagnosis not present

## 2021-05-04 MED ORDER — METOPROLOL TARTRATE 25 MG PO TABS: 37.5000 mg | ORAL_TABLET | Freq: Two times a day (BID) | ORAL | 3 refills | Status: DC

## 2021-05-04 NOTE — Patient Instructions (Signed)
Increase metoprolol to 37.5mg twice a day 

## 2021-05-04 NOTE — Progress Notes (Signed)
Primary Care Physician: Shane Barrack, MD Referring Physician: [facility] ER f/u    Shane Miles is a 55 y.o. male with no medical history that presented for an colonoscopy 10/29/21and found to be in afib. He was sent to the ER. He was sent home on metoprolol and asa for a CHA2DS2VASc score of 0. He has noted palpitations over the last 3 weeks. He is not terribly symptomatic with afib. He did not take his am BB this am and has a HR over 110. He has not tracked at home to see HR control and if he is persistent or paroxysmal. I discussed with him starting anticoagulation  to see if a CV may be in the future.   He denies any tobacco use, no alcohol, has stopped caffeine. He has been told by his wife that he snores and has had witnessed apnea. If he sleeps on his side he snores less. His father has afib as well. Denies a bleeding history.   He returns today, 07/08/20 and is in [place]. His zio patch showed 100% afib but he feels that he went back into SR in the last week,ekg shows SR today. He did note blood in stool one time. SInce no cardioversion is needed and his CHA2DS2VASc score of 0, then he can stop xarelto after the 3 tablets that he has left. I discussed I would stay on the metoprolol to ensure SR especially with him repeating colonoscopy in the near future. I will order an echo now that he is  in [place].   F/u in [place] [facility], 05/04/21. He attended a retirement party last Friday PM and had 6 beers. He very seldom drinks alcohol. Saturday PM after doing some yard work, he felt his heart become irregular. He called the office yesterday and metoprolol 25 mg bid was started as well as xarelto 20 mg daily. His HR is better controlled since starting BB and he feels improved.   Today, he denies symptoms of palpitations, chest pain, shortness of breath, orthopnea, PND, lower extremity edema, dizziness, presyncope, syncope, or neurologic sequela. The patient is tolerating medications without difficulties and is  otherwise without complaint today.   Past Medical History:  Diagnosis Date   Arthritis    Past Surgical History:  Procedure Laterality Date   APPENDECTOMY  2003   FOREIGN BODY REMOVAL Right    right thing   left knee meniscus  2010   right knee meniscus  2013    Current Outpatient Medications  Medication Sig Dispense Refill   Acetaminophen (TYLENOL PO) Take 500 mg by mouth as needed.     alfuzosin (UROXATRAL) 10 MG 24 hr tablet TAKE 1 TABLET BY MOUTH EVERYDAY AT BEDTIME 90 tablet 4   diltiazem (CARDIZEM) 30 MG tablet Take 30 mg by mouth every 4 (four) hours as needed.     rivaroxaban (XARELTO) 20 MG TABS tablet Take 1 tablet (20 mg total) by mouth daily with supper. 30 tablet 3   metoprolol tartrate (LOPRESSOR) 25 MG tablet Take 1.5 tablets (37.5 mg total) by mouth 2 (two) times daily. 60 tablet 3   Current Facility-Administered Medications  Medication Dose Route Frequency Provider Last Rate Last Admin   0.9 %  sodium chloride infusion  500 mL Intravenous Once Shane Banister, MD        No Known Allergies  Social History   Socioeconomic History   Marital status: Married    Spouse name: Not on file   Number of children: Not  on file   Years of education: Not on file   Highest education level: Not on file  Occupational History   Not on file  Tobacco Use   Smoking status: Never   Smokeless tobacco: Never  Vaping Use   Vaping Use: Never used  Substance and Sexual Activity   Alcohol use: Never   Drug use: Never   Sexual activity: Yes  Other Topics Concern   Not on file  Social History Narrative   Married   Works in [place] Strain: Not on file  Food Insecurity: Not on file  Transportation Needs: Not on file  Physical Activity: Not on file  Stress: Not on file  Social Connections: Not on file  Intimate Partner Violence: Not on file    Family History  Problem Relation Age of Onset   Arthritis Mother     Hypertension Mother    Hypertension Father    Colon cancer Neg Hx    Colon polyps Neg Hx    Esophageal cancer Neg Hx    Rectal cancer Neg Hx    Stomach cancer Neg Hx     ROS- All systems are reviewed and negative except as per the HPI above  Physical Exam: Vitals:   05/04/21 0913  BP: 136/86  Pulse: (!) 109  Weight: 112.1 kg  Height: 6\' 2"  (1.88 m)   Wt Readings from Last 3 Encounters:  05/04/21 112.1 kg  03/03/21 109.1 kg  02/05/21 110.2 kg    Labs: Lab Results  Component Value Date   NA 139 06/05/2020   K 4.0 06/05/2020   CL 105 06/05/2020   CO2 23 06/05/2020   GLUCOSE 99 06/05/2020   BUN 12 06/05/2020   CREATININE 1.03 06/05/2020   CALCIUM 9.7 06/05/2020   MG 2.1 06/05/2020   No results found for: INR No results found for: CHOL, HDL, LDLCALC, TRIG   GEN- The patient is well appearing, alert and oriented x 3 today.   Head- normocephalic, atraumatic Eyes-  Sclera clear, conjunctiva pink Ears- hearing intact Oropharynx- clear Neck- supple, no JVP Lymph- no cervical lymphadenopathy Lungs- Clear to ausculation bilaterally, normal work of breathing Heart- irregular rate and rhythm, no murmurs, rubs or gallops, PMI not laterally displaced GI- soft, NT, ND, + BS Extremities- no clubbing, cyanosis, or edema MS- no significant deformity or atrophy Skin- no rash or lesion Psych- euthymic mood, full affect Neuro- strength and sensation are intact  EKG- afib at 109 bpm, qrs int 78 ms, qtc 439 ms  Epic records reviewed    Assessment and Plan: 1.  Persistent afib  Present since last Friday  Probably triggered by alcohol  Will increase metoprolol to 37.5 mg bid for better rate control    2. CHA2DS2VASc score of 0 Pt started back on xarelto 20 mg  Stressed importance of not missing any doses if CV may be in his future   I will see  back in 10 days, if he feels that he goes back into SR, he will call the office sooner    Shane Miles, [facility] [address] [place], [place] [address] [phone]

## 2021-05-13 ENCOUNTER — Encounter (HOSPITAL_COMMUNITY): Payer: Self-pay | Admitting: Nurse Practitioner

## 2021-05-13 ENCOUNTER — Other Ambulatory Visit: Payer: Self-pay

## 2021-05-13 ENCOUNTER — Ambulatory Visit (HOSPITAL_COMMUNITY)
Admission: RE | Admit: 2021-05-13 | Discharge: 2021-05-13 | Disposition: A | Discharge status: Home / Self Care | Payer: 59 | Source: Ambulatory Visit | Attending: Nurse Practitioner | Admitting: Nurse Practitioner

## 2021-05-13 VITALS — BP 126/92 | HR 113 | Ht 74.0 in | Wt 246.4 lb

## 2021-05-13 DIAGNOSIS — Z8249 Family history of ischemic heart disease and other diseases of the circulatory system: Secondary | ICD-10-CM | POA: Insufficient documentation

## 2021-05-13 DIAGNOSIS — Z7901 Long term (current) use of anticoagulants: Secondary | ICD-10-CM | POA: Insufficient documentation

## 2021-05-13 DIAGNOSIS — I4819 Other persistent atrial fibrillation: Secondary | ICD-10-CM | POA: Insufficient documentation

## 2021-05-13 DIAGNOSIS — D6869 Other thrombophilia: Secondary | ICD-10-CM

## 2021-05-13 DIAGNOSIS — I480 Paroxysmal atrial fibrillation: Secondary | ICD-10-CM | POA: Diagnosis not present

## 2021-05-13 DIAGNOSIS — Z79899 Other long term (current) drug therapy: Secondary | ICD-10-CM | POA: Insufficient documentation

## 2021-05-13 LAB — CBC
HCT: 42.8 % (ref 39.0–52.0)
Hemoglobin: 14.7 g/dL (ref 13.0–17.0)
MCH: 31.5 pg (ref 26.0–34.0)
MCHC: 34.3 g/dL (ref 30.0–36.0)
MCV: 91.8 fL (ref 80.0–100.0)
Platelets: 225 10*3/uL (ref 150–400)
RBC: 4.66 MIL/uL (ref 4.22–5.81)
RDW: 12.3 % (ref 11.5–15.5)
WBC: 6.5 10*3/uL (ref 4.0–10.5)
nRBC: 0 % (ref 0.0–0.2)

## 2021-05-13 LAB — BASIC METABOLIC PANEL
Anion gap: 10 (ref 5–15)
BUN: 21 mg/dL — ABNORMAL HIGH (ref 6–20)
CO2: 25 mmol/L (ref 22–32)
Calcium: 9.4 mg/dL (ref 8.9–10.3)
Chloride: 104 mmol/L (ref 98–111)
Creatinine, Ser: 0.93 mg/dL (ref 0.61–1.24)
GFR, Estimated: >60 mL/min (ref >60)
Glucose, Bld: 116 mg/dL — ABNORMAL HIGH (ref 70–99)
Potassium: 3.7 mmol/L (ref 3.5–5.1)
Sodium: 139 mmol/L (ref 135–145)

## 2021-05-13 NOTE — H&P (View-Only) (Signed)
Primary Care Physician: [name], MD Referring Physician: [facility] ER f/u    Shane Miles is a 55 y.o. male with no medical history that presented for an colonoscopy 10/29/21and found to be in afib. He was sent to the ER. He was sent home on metoprolol and asa for a CHA2DS2VASc score of 0. He has noted palpitations over the last 3 weeks. He is not terribly symptomatic with afib. He did not take his am BB this am and has a HR over 110. He has not tracked at home to see HR control and if he is persistent or paroxysmal. I discussed with him starting anticoagulation  to see if a CV may be in the future.   He denies any tobacco use, no alcohol, has stopped caffeine. He has been told by his wife that he snores and has had witnessed apnea. If he sleeps on his side he snores less. His father has afib as well. Denies a bleeding history.   He returns today, 07/08/20 and is in [place]. His zio patch showed 100% afib but he feels that he went back into SR in the last week,ekg shows SR today. He did note blood in stool one time. SInce no cardioversion is needed and his CHA2DS2VASc score of 0, then he can stop xarelto after the 3 tablets that he has left. I discussed I would stay on the metoprolol to ensure SR especially with him repeating colonoscopy in the near future. I will order an echo now that he is  in [place].   F/u in [place] [facility], 05/04/21. He attended a retirement party last Friday PM and had 6 beers. He very seldom drinks alcohol. Saturday PM after doing some yard work, he felt his heart become irregular. He called the office yesterday and metoprolol 25 mg bid was started as well as xarelto 20 mg daily. His HR is better controlled since starting BB and he feels improved.   Return 05/13/21. He remains in afib. Discussed cardioversion and he is in agreement, eligible after 10/17. He has not missed any anticoagulation since starting 05/03/21.    Today, he denies symptoms of palpitations, chest pain, shortness of  breath, orthopnea, PND, lower extremity edema, dizziness, presyncope, syncope, or neurologic sequela. The patient is tolerating medications without difficulties and is otherwise without complaint today.   Past Medical History:  Diagnosis Date   Arthritis    Past Surgical History:  Procedure Laterality Date   APPENDECTOMY  2003   FOREIGN BODY REMOVAL Right    right thing   left knee meniscus  2010   right knee meniscus  2013    Current Outpatient Medications  Medication Sig Dispense Refill   Acetaminophen (TYLENOL PO) Take 500 mg by mouth as needed.     alfuzosin (UROXATRAL) 10 MG 24 hr tablet TAKE 1 TABLET BY MOUTH EVERYDAY AT BEDTIME 90 tablet 4   diltiazem (CARDIZEM) 30 MG tablet Take 30 mg by mouth every 4 (four) hours as needed.     metoprolol tartrate (LOPRESSOR) 25 MG tablet Take 1.5 tablets (37.5 mg total) by mouth 2 (two) times daily. 60 tablet 3   rivaroxaban (XARELTO) 20 MG TABS tablet Take 1 tablet (20 mg total) by mouth daily with supper. 30 tablet 3   Current Facility-Administered Medications  Medication Dose Route Frequency Provider Last Rate Last Admin   0.9 %  sodium chloride infusion  500 mL Intravenous Once Milus Banister, MD        No  Known Allergies  Social History   Socioeconomic History   Marital status: Married    Spouse name: Not on file   Number of children: Not on file   Years of education: Not on file   Highest education level: Not on file  Occupational History   Not on file  Tobacco Use   Smoking status: Never   Smokeless tobacco: Never  Vaping Use   Vaping Use: Never used  Substance and Sexual Activity   Alcohol use: Never   Drug use: Never   Sexual activity: Yes  Other Topics Concern   Not on file  Social History Narrative   Married   Works in [place] Strain: Not on file  Food Insecurity: Not on file  Transportation Needs: Not on file  Physical Activity: Not on file   Stress: Not on file  Social Connections: Not on file  Intimate Partner Violence: Not on file    Family History  Problem Relation Age of Onset   Arthritis Mother    Hypertension Mother    Hypertension Father    Colon cancer Neg Hx    Colon polyps Neg Hx    Esophageal cancer Neg Hx    Rectal cancer Neg Hx    Stomach cancer Neg Hx     ROS- All systems are reviewed and negative except as per the HPI above  Physical Exam: Vitals:   05/13/21 1502  BP: (!) 126/92  Pulse: (!) 113  Weight: 111.8 kg  Height: 6\' 2"  (1.88 m)   Wt Readings from Last 3 Encounters:  05/13/21 111.8 kg  05/04/21 112.1 kg  03/03/21 109.1 kg    Labs: Lab Results  Component Value Date   NA 139 06/05/2020   K 4.0 06/05/2020   CL 105 06/05/2020   CO2 23 06/05/2020   GLUCOSE 99 06/05/2020   BUN 12 06/05/2020   CREATININE 1.03 06/05/2020   CALCIUM 9.7 06/05/2020   MG 2.1 06/05/2020   No results found for: INR No results found for: CHOL, HDL, LDLCALC, TRIG   GEN- The patient is well appearing, alert and oriented x 3 today.   Head- normocephalic, atraumatic Eyes-  Sclera clear, conjunctiva pink Ears- hearing intact Oropharynx- clear Neck- supple, no JVP Lymph- no cervical lymphadenopathy Lungs- Clear to ausculation bilaterally, normal work of breathing Heart- irregular rate and rhythm, no murmurs, rubs or gallops, PMI not laterally displaced GI- soft, NT, ND, + BS Extremities- no clubbing, cyanosis, or edema MS- no significant deformity or atrophy Skin- no rash or lesion Psych- euthymic mood, full affect Neuro- strength and sensation are intact  EKG- afib at 113bpm, qrs int 78 ms, qtc 439 ms  Epic records reviewed    Assessment and Plan: 1.  Persistent afib  Present since  Friday 9/23 Probably triggered by alcohol  Increased  metoprolol to 37.5 mg bid for better rate control, go back to 25 mg bid day of cardioversion   Cbc/bmet today    2. CHA2DS2VASc score of 0 Pt started back  on xarelto 20 mg  9/26 States no missed doses since starting   I will see   one week after CV   [name], [facility] [address] [place], [place] [address] [phone]

## 2021-05-13 NOTE — Patient Instructions (Signed)
Cardioversion scheduled for Tuesday October 18  - Arrive at the [facility] and go to admitting at [facility] not eat or drink anything after midnight the night prior to your procedure.  - Take all your morning medication with a sip of water prior to arrival.  - You will not be able to drive home after your procedure.  Patients will be asked to: to mask in public and hand hygiene (no longer quarantine) in the 3 days prior to surgery, to report if any COVID-19-like illness or household contacts to COVID-19 to determine need for testing

## 2021-05-13 NOTE — Progress Notes (Addendum)
Primary Care Physician: [name], MD Referring Physician: [facility] ER f/u    Shane Miles is a 55 y.o. male with no medical history that presented for an colonoscopy 10/29/21and found to be in afib. He was sent to the ER. He was sent home on metoprolol and asa for a CHA2DS2VASc score of 0. He has noted palpitations over the last 3 weeks. He is not terribly symptomatic with afib. He did not take his am BB this am and has a HR over 110. He has not tracked at home to see HR control and if he is persistent or paroxysmal. I discussed with him starting anticoagulation  to see if a CV may be in the future.   He denies any tobacco use, no alcohol, has stopped caffeine. He has been told by his wife that he snores and has had witnessed apnea. If he sleeps on his side he snores less. His father has afib as well. Denies a bleeding history.   He returns today, 07/08/20 and is in [place]. His zio patch showed 100% afib but he feels that he went back into SR in the last week,ekg shows SR today. He did note blood in stool one time. SInce no cardioversion is needed and his CHA2DS2VASc score of 0, then he can stop xarelto after the 3 tablets that he has left. I discussed I would stay on the metoprolol to ensure SR especially with him repeating colonoscopy in the near future. I will order an echo now that he is  in [place].   F/u in [place] clinic, 05/04/21. He attended a retirement party last Friday PM and had 6 beers. He very seldom drinks alcohol. Saturday PM after doing some yard work, he felt his heart become irregular. He called the office yesterday and metoprolol 25 mg bid was started as well as xarelto 20 mg daily. His HR is better controlled since starting BB and he feels improved.   Return 05/13/21. He remains in afib. Discussed cardioversion and he is in agreement, eligible after 10/17. He has not missed any anticoagulation since starting 05/03/21.    Today, he denies symptoms of palpitations, chest pain, shortness of  breath, orthopnea, PND, lower extremity edema, dizziness, presyncope, syncope, or neurologic sequela. The patient is tolerating medications without difficulties and is otherwise without complaint today.   Past Medical History:  Diagnosis Date   Arthritis    Past Surgical History:  Procedure Laterality Date   APPENDECTOMY  2003   FOREIGN BODY REMOVAL Right    right thing   left knee meniscus  2010   right knee meniscus  2013    Current Outpatient Medications  Medication Sig Dispense Refill   Acetaminophen (TYLENOL PO) Take 500 mg by mouth as needed.     alfuzosin (UROXATRAL) 10 MG 24 hr tablet TAKE 1 TABLET BY MOUTH EVERYDAY AT BEDTIME 90 tablet 4   diltiazem (CARDIZEM) 30 MG tablet Take 30 mg by mouth every 4 (four) hours as needed.     metoprolol tartrate (LOPRESSOR) 25 MG tablet Take 1.5 tablets (37.5 mg total) by mouth 2 (two) times daily. 60 tablet 3   rivaroxaban (XARELTO) 20 MG TABS tablet Take 1 tablet (20 mg total) by mouth daily with supper. 30 tablet 3   Current Facility-Administered Medications  Medication Dose Route Frequency Provider Last Rate Last Admin   0.9 %  sodium chloride infusion  500 mL Intravenous Once Milus Banister, MD        No  Known Allergies  Social History   Socioeconomic History   Marital status: Married    Spouse name: Not on file   Number of children: Not on file   Years of education: Not on file   Highest education level: Not on file  Occupational History   Not on file  Tobacco Use   Smoking status: Never   Smokeless tobacco: Never  Vaping Use   Vaping Use: Never used  Substance and Sexual Activity   Alcohol use: Never   Drug use: Never   Sexual activity: Yes  Other Topics Concern   Not on file  Social History Narrative   Married   Works in [place] Strain: Not on file  Food Insecurity: Not on file  Transportation Needs: Not on file  Physical Activity: Not on file   Stress: Not on file  Social Connections: Not on file  Intimate Partner Violence: Not on file    Family History  Problem Relation Age of Onset   Arthritis Mother    Hypertension Mother    Hypertension Father    Colon cancer Neg Hx    Colon polyps Neg Hx    Esophageal cancer Neg Hx    Rectal cancer Neg Hx    Stomach cancer Neg Hx     ROS- All systems are reviewed and negative except as per the HPI above  Physical Exam: Vitals:   05/13/21 1502  BP: (!) 126/92  Pulse: (!) 113  Weight: 111.8 kg  Height: 6\' 2"  (1.88 m)   Wt Readings from Last 3 Encounters:  05/13/21 111.8 kg  05/04/21 112.1 kg  03/03/21 109.1 kg    Labs: Lab Results  Component Value Date   NA 139 06/05/2020   K 4.0 06/05/2020   CL 105 06/05/2020   CO2 23 06/05/2020   GLUCOSE 99 06/05/2020   BUN 12 06/05/2020   CREATININE 1.03 06/05/2020   CALCIUM 9.7 06/05/2020   MG 2.1 06/05/2020   No results found for: INR No results found for: CHOL, HDL, LDLCALC, TRIG   GEN- The patient is well appearing, alert and oriented x 3 today.   Head- normocephalic, atraumatic Eyes-  Sclera clear, conjunctiva pink Ears- hearing intact Oropharynx- clear Neck- supple, no JVP Lymph- no cervical lymphadenopathy Lungs- Clear to ausculation bilaterally, normal work of breathing Heart- irregular rate and rhythm, no murmurs, rubs or gallops, PMI not laterally displaced GI- soft, NT, ND, + BS Extremities- no clubbing, cyanosis, or edema MS- no significant deformity or atrophy Skin- no rash or lesion Psych- euthymic mood, full affect Neuro- strength and sensation are intact  EKG- afib at 113bpm, qrs int 78 ms, qtc 439 ms  Epic records reviewed    Assessment and Plan: 1.  Persistent afib  Present since  Friday 9/23 Probably triggered by alcohol  Increased  metoprolol to 37.5 mg bid for better rate control, go back to 25 mg bid day of cardioversion   Cbc/bmet today    2. CHA2DS2VASc score of 0 Pt started back  on xarelto 20 mg  9/26 States no missed doses since starting   I will see   one week after CV   Raghav Verrilli C. Jamala Kohen, [facility] [address] [place],  [address] [phone]

## 2021-05-14 ENCOUNTER — Encounter (HOSPITAL_COMMUNITY): Payer: Self-pay | Admitting: Internal Medicine

## 2021-05-14 NOTE — Progress Notes (Signed)
Attempted to obtain medical history via telephone, unable to reach at this time. I left a voicemail to return pre surgical testing department's phone call.  

## 2021-05-25 ENCOUNTER — Encounter (HOSPITAL_COMMUNITY)
Admission: RE | Disposition: A | Discharge status: Home / Self Care | Payer: Self-pay | Source: Home / Self Care | Attending: Internal Medicine

## 2021-05-25 ENCOUNTER — Ambulatory Visit (HOSPITAL_COMMUNITY): Payer: 59 | Admitting: Anesthesiology

## 2021-05-25 ENCOUNTER — Ambulatory Visit (HOSPITAL_COMMUNITY)
Admission: RE | Admit: 2021-05-25 | Discharge: 2021-05-25 | Disposition: A | Discharge status: Home / Self Care | Payer: 59 | Attending: Internal Medicine | Admitting: Internal Medicine

## 2021-05-25 ENCOUNTER — Encounter (HOSPITAL_COMMUNITY): Payer: Self-pay | Admitting: Internal Medicine

## 2021-05-25 DIAGNOSIS — I4819 Other persistent atrial fibrillation: Secondary | ICD-10-CM | POA: Insufficient documentation

## 2021-05-25 DIAGNOSIS — Z7901 Long term (current) use of anticoagulants: Secondary | ICD-10-CM | POA: Diagnosis not present

## 2021-05-25 DIAGNOSIS — Z79899 Other long term (current) drug therapy: Secondary | ICD-10-CM | POA: Insufficient documentation

## 2021-05-25 DIAGNOSIS — I4891 Unspecified atrial fibrillation: Secondary | ICD-10-CM

## 2021-05-25 HISTORY — PX: CARDIOVERSION: SHX1299

## 2021-05-25 SURGERY — CARDIOVERSION
Anesthesia: General

## 2021-05-25 MED ORDER — SODIUM CHLORIDE 0.9 % IV SOLN: INTRAVENOUS | Status: DC | PRN

## 2021-05-25 MED ORDER — PROPOFOL 10 MG/ML IV BOLUS
INTRAVENOUS | Status: DC | PRN
  Administered 2021-05-25: 50 mg via INTRAVENOUS

## 2021-05-25 MED ORDER — LIDOCAINE 2% (20 MG/ML) 5 ML SYRINGE
INTRAMUSCULAR | Status: DC | PRN
  Administered 2021-05-25: 40 mg via INTRAVENOUS

## 2021-05-25 NOTE — CV Procedure (Signed)
CARDIOVERSION for Atrial Fibrillation  Patient anesthetized with 40 mg lidocaine and 50 mg propofol  With pads in AP position, patient cardioverted to SR with 200 J synchronized biphasic energy  Procedure was without complication  12 lead EKG pending    Will confirm f/u in afib clinic   Pt wonders if he may have sleep apnea.  Will let clinic know.     Dorris Carnes MD

## 2021-05-25 NOTE — Anesthesia Procedure Notes (Signed)
Procedure Name: General with mask airway Date/Time: 05/25/2021 8:27 AM Performed by: Rande Brunt, CRNA Pre-anesthesia Checklist: Patient identified, Emergency Drugs available, Suction available and Patient being monitored Patient Re-evaluated:Patient Re-evaluated prior to induction Oxygen Delivery Method: Simple face mask Preoxygenation: Pre-oxygenation with 100% oxygen Induction Type: IV induction Dental Injury: Teeth and Oropharynx as per pre-operative assessment

## 2021-05-25 NOTE — Interval H&P Note (Signed)
History and Physical Interval Note:  05/25/2021 7:57 AM  Shane Miles  has presented today for surgery, with the diagnosis of AFIB.  The various methods of treatment have been discussed with the patient and family. After consideration of risks, benefits and other options for treatment, the patient has consented to  Procedure(s): CARDIOVERSION (N/A) as a surgical intervention.  The patient's history has been reviewed, patient examined, no change in status, stable for surgery.  I have reviewed the patient's chart and labs.  Questions were answered to the patient's satisfaction.     Dorris Carnes

## 2021-05-25 NOTE — Transfer of Care (Signed)
Immediate Anesthesia Transfer of Care Note  Patient: Shane Miles  Procedure(s) Performed: CARDIOVERSION  Patient Location: Endoscopy Unit  Anesthesia Type:General  Level of Consciousness: awake, drowsy and patient cooperative  Airway & Oxygen Therapy: Patient Spontanous Breathing and Patient connected to face mask oxygen  Post-op Assessment: Report given to RN, Post -op Vital signs reviewed and stable and Patient moving all extremities X 4  Post vital signs: Reviewed and stable  Last Vitals:  Vitals Value Taken Time  BP 159/116   Temp    Pulse 60   Resp 14   SpO2 100     Last Pain:  Vitals:   05/25/21 0800  TempSrc: Temporal  PainSc: 0-No pain         Complications: No notable events documented.

## 2021-05-26 ENCOUNTER — Encounter (HOSPITAL_COMMUNITY): Payer: Self-pay | Admitting: Internal Medicine

## 2021-05-27 NOTE — Anesthesia Postprocedure Evaluation (Signed)
Anesthesia Post Note  Patient: Alvester Eads  Procedure(s) Performed: CARDIOVERSION     Patient location during evaluation: Endoscopy Anesthesia Type: General Level of consciousness: awake and alert Pain management: pain level controlled Vital Signs Assessment: post-procedure vital signs reviewed and stable Respiratory status: spontaneous breathing, nonlabored ventilation, respiratory function stable and patient connected to nasal cannula oxygen Cardiovascular status: blood pressure returned to baseline and stable Postop Assessment: no apparent nausea or vomiting Anesthetic complications: no   No notable events documented.  Last Vitals:  Vitals:   05/25/21 0845 05/25/21 0855  BP: (!) 149/102 (!) 150/99  Pulse: 60 65  Resp: 19 (!) 28  Temp: 36.6 C   SpO2: 99% 97%    Last Pain:  Vitals:   05/25/21 0905  TempSrc:   PainSc: 0-No pain                 Kortnie Stovall

## 2021-05-27 NOTE — Anesthesia Preprocedure Evaluation (Signed)
Anesthesia Evaluation  Patient identified by MRN, date of birth, ID band Patient awake    Reviewed: Allergy & Precautions, NPO status , Patient's Chart, lab work & pertinent test results  History of Anesthesia Complications Negative for: history of anesthetic complications  Airway Mallampati: II  TM Distance: >3 FB Neck ROM: Full    Dental  (+) Dental Advisory Given   Pulmonary neg pulmonary ROS,    breath sounds clear to auscultation       Cardiovascular + dysrhythmias Atrial Fibrillation  Rhythm:Irregular     Neuro/Psych negative neurological ROS  negative psych ROS   GI/Hepatic negative GI ROS, Neg liver ROS,   Endo/Other  negative endocrine ROS  Renal/GU negative Renal ROS     Musculoskeletal  (+) Arthritis ,   Abdominal   Peds  Hematology  (+) Blood dyscrasia, , xarelto   Anesthesia Other Findings   Reproductive/Obstetrics                             Anesthesia Physical Anesthesia Plan  ASA: 2  Anesthesia Plan: General   Post-op Pain Management:    Induction: Intravenous  PONV Risk Score and Plan: 2 and Treatment may vary due to age or medical condition  Airway Management Planned: Mask  Additional Equipment: None  Intra-op Plan:   Post-operative Plan:   Informed Consent: I have reviewed the patients History and Physical, chart, labs and discussed the procedure including the risks, benefits and alternatives for the proposed anesthesia with the patient or authorized representative who has indicated his/her understanding and acceptance.     Dental advisory given  Plan Discussed with: CRNA and Anesthesiologist  Anesthesia Plan Comments:         Anesthesia Quick Evaluation

## 2021-06-01 ENCOUNTER — Ambulatory Visit (HOSPITAL_COMMUNITY)
Admission: RE | Admit: 2021-06-01 | Discharge: 2021-06-01 | Disposition: A | Discharge status: Home / Self Care | Payer: 59 | Source: Ambulatory Visit | Attending: Nurse Practitioner | Admitting: Nurse Practitioner

## 2021-06-01 ENCOUNTER — Encounter (HOSPITAL_COMMUNITY): Payer: Self-pay | Admitting: Nurse Practitioner

## 2021-06-01 ENCOUNTER — Other Ambulatory Visit: Payer: Self-pay

## 2021-06-01 VITALS — BP 154/86 | HR 54 | Ht 74.0 in | Wt 240.0 lb

## 2021-06-01 DIAGNOSIS — I10 Essential (primary) hypertension: Secondary | ICD-10-CM | POA: Diagnosis not present

## 2021-06-01 DIAGNOSIS — I480 Paroxysmal atrial fibrillation: Secondary | ICD-10-CM | POA: Insufficient documentation

## 2021-06-01 DIAGNOSIS — Z8249 Family history of ischemic heart disease and other diseases of the circulatory system: Secondary | ICD-10-CM | POA: Insufficient documentation

## 2021-06-01 DIAGNOSIS — R0683 Snoring: Secondary | ICD-10-CM | POA: Diagnosis not present

## 2021-06-01 DIAGNOSIS — Z7901 Long term (current) use of anticoagulants: Secondary | ICD-10-CM | POA: Insufficient documentation

## 2021-06-01 MED ORDER — METOPROLOL TARTRATE 25 MG PO TABS: 25.0000 mg | ORAL_TABLET | Freq: Two times a day (BID) | ORAL | Status: DC

## 2021-06-01 NOTE — Progress Notes (Signed)
Primary Care Physician: [name], MD Referring Physician: [facility] ER f/u    Shane Miles is a 55 y.o. male with no medical history that presented for an colonoscopy 10/29/21and found to be in afib. He was sent to the ER. He was sent home on metoprolol and asa for a CHA2DS2VASc score of 0. He has noted palpitations over the last 3 weeks. He is not terribly symptomatic with afib. He did not take his am BB this am and has a HR over 110. He has not tracked at home to see HR control and if he is persistent or paroxysmal. I discussed with him starting anticoagulation  to see if a CV may be in the future.   He denies any tobacco use, no alcohol, has stopped caffeine. He has been told by his wife that he snores and has had witnessed apnea. If he sleeps on his side he snores less. His father has afib as well. Denies a bleeding history.   He returns today, 07/08/20 and is in [place]. His zio patch showed 100% afib but he feels that he went back into SR in the last week,ekg shows SR today. He did note blood in stool one time. SInce no cardioversion is needed and his CHA2DS2VASc score of 0, then he can stop xarelto after the 3 tablets that he has left. I discussed I would stay on the metoprolol to ensure SR especially with him repeating colonoscopy in the near future. I will order an echo now that he is  in [place].   F/u in [place] clinic, 05/04/21. He attended a retirement party last Friday PM and had 6 beers. He very seldom drinks alcohol. Saturday PM after doing some yard work, he felt his heart become irregular. He called the office yesterday and metoprolol 25 mg bid was started as well as xarelto 20 mg daily. His HR is better controlled since starting BB and he feels improved.   Return 05/13/21. He remains in afib. Discussed cardioversion and he is in agreement, eligible after 10/17. He has not missed any anticoagulation since starting 05/03/21.   F/u in afib clinic, 06/01/21,  after successful cardioversion 10/18 and  remains in SR. His BP is mildly elevated here. He will recheck  at home 2x a day for the next 7-14 days and if it remains  elevated contact the office. He will be ordered a sleep study to further evaluate snoring. He will take xarelto for 4 weeks following cardioversion and then can stop drug with CHA2DS2VASc  score of 0.   Today, he denies symptoms of palpitations, chest pain, shortness of breath, orthopnea, PND, lower extremity edema, dizziness, presyncope, syncope, or neurologic sequela. The patient is tolerating medications without difficulties and is otherwise without complaint today.   Past Medical History:  Diagnosis Date   Arthritis    Past Surgical History:  Procedure Laterality Date   APPENDECTOMY  2003   CARDIOVERSION N/A 05/25/2021   Procedure: CARDIOVERSION;  Surgeon: Fay Records, MD;  Location: [facility] ENDOSCOPY;  Service: Cardiovascular;  Laterality: N/A;   FOREIGN BODY REMOVAL Right    right thing   left knee meniscus  2010   right knee meniscus  2013    Current Outpatient Medications  Medication Sig Dispense Refill   Acetaminophen (TYLENOL PO) Take 500 mg by mouth as needed.     alfuzosin (UROXATRAL) 10 MG 24 hr tablet TAKE 1 TABLET BY MOUTH EVERYDAY AT BEDTIME 90 tablet 4   rivaroxaban (XARELTO) 20  MG TABS tablet Take 1 tablet (20 mg total) by mouth daily with supper. 30 tablet 3   metoprolol tartrate (LOPRESSOR) 25 MG tablet Take 1 tablet (25 mg total) by mouth 2 (two) times daily.     Current Facility-Administered Medications  Medication Dose Route Frequency Provider Last Rate Last Admin   0.9 %  sodium chloride infusion  500 mL Intravenous Once Milus Banister, MD        No Known Allergies  Social History   Socioeconomic History   Marital status: Married    Spouse name: Not on file   Number of children: Not on file   Years of education: Not on file   Highest education level: Not on file  Occupational History   Not on file  Tobacco Use   Smoking status:  Never   Smokeless tobacco: Never  Vaping Use   Vaping Use: Never used  Substance and Sexual Activity   Alcohol use: Never   Drug use: Never   Sexual activity: Yes  Other Topics Concern   Not on file  Social History Narrative   Married   Works in [employer] Strain: Not on file  Food Insecurity: Not on file  Transportation Needs: Not on file  Physical Activity: Not on file  Stress: Not on file  Social Connections: Not on file  Intimate Partner Violence: Not on file    Family History  Problem Relation Age of Onset   Arthritis Mother    Hypertension Mother    Hypertension Father    Colon cancer Neg Hx    Colon polyps Neg Hx    Esophageal cancer Neg Hx    Rectal cancer Neg Hx    Stomach cancer Neg Hx     ROS- All systems are reviewed and negative except as per the HPI above  Physical Exam: Vitals:   06/01/21 1349  BP: (!) 154/86  Pulse: (!) 54  Weight: 108.9 kg  Height: 6\' 2"  (1.88 m)   Wt Readings from Last 3 Encounters:  06/01/21 108.9 kg  05/25/21 108.9 kg  05/13/21 111.8 kg    Labs: Lab Results  Component Value Date   NA 139 05/13/2021   K 3.7 05/13/2021   CL 104 05/13/2021   CO2 25 05/13/2021   GLUCOSE 116 (H) 05/13/2021   BUN 21 (H) 05/13/2021   CREATININE 0.93 05/13/2021   CALCIUM 9.4 05/13/2021   MG 2.1 06/05/2020   No results found for: INR No results found for: CHOL, HDL, LDLCALC, TRIG   GEN- The patient is well appearing, alert and oriented x 3 today.   Head- normocephalic, atraumatic Eyes-  Sclera clear, conjunctiva pink Ears- hearing intact Oropharynx- clear Neck- supple, no JVP Lymph- no cervical lymphadenopathy Lungs- Clear to ausculation bilaterally, normal work of breathing Heart- regular rate and rhythm, no murmurs, rubs or gallops, PMI not laterally displaced GI- soft, NT, ND, + BS Extremities- no clubbing, cyanosis, or edema MS- no significant deformity or atrophy Skin-  no rash or lesion Psych- euthymic mood, full affect Neuro- strength and sensation are intact  EKG- sinus brady at 54 bpm Epic records reviewed    Assessment and Plan: 1.  Persistent afib  Present since  Friday 9/23 Probably triggered by alcohol  Continue 25 mg lopressor  bid     2. CHA2DS2VASc score of 0 Pt started back on xarelto 20 mg  9/26  Continue for 4 weeks after cardioversion  and can then stop   3. HTN Mildly elevated today  Check at home for the next 2 weeks 1-2 x a day and report to office and report  BP if systolic is  consistently over 825 systolic and diastolic is over 85    4. Snoring Sleep study ordered   I will see as needed    Butch Penny C. Bilan Tedesco, [facility] [address] [place], [place] [address] [phone]

## 2021-06-01 NOTE — Patient Instructions (Signed)
Can stop Xarelto after 11/19  Follow up as needed.  Call if BP staying above 140/85

## 2021-06-04 ENCOUNTER — Other Ambulatory Visit (HOSPITAL_COMMUNITY): Payer: Self-pay

## 2021-06-04 MED ORDER — RIVAROXABAN 20 MG PO TABS: 20.0000 mg | ORAL_TABLET | Freq: Every day | ORAL | 0 refills | Status: DC

## 2021-06-07 ENCOUNTER — Telehealth: Payer: Self-pay | Admitting: *Deleted

## 2021-06-07 NOTE — Telephone Encounter (Signed)
Prior Authorization for split night sleep study sent to [facility] via web portal. Tracking Number [id number].

## 2021-06-23 ENCOUNTER — Encounter: Payer: Self-pay | Admitting: Orthopaedic Surgery

## 2021-06-23 ENCOUNTER — Ambulatory Visit: Payer: Self-pay

## 2021-06-23 ENCOUNTER — Ambulatory Visit (INDEPENDENT_AMBULATORY_CARE_PROVIDER_SITE_OTHER): Payer: 59 | Admitting: Orthopaedic Surgery

## 2021-06-23 ENCOUNTER — Other Ambulatory Visit: Payer: Self-pay

## 2021-06-23 DIAGNOSIS — G8929 Other chronic pain: Secondary | ICD-10-CM | POA: Diagnosis not present

## 2021-06-23 DIAGNOSIS — M25561 Pain in right knee: Secondary | ICD-10-CM | POA: Diagnosis not present

## 2021-06-23 NOTE — Progress Notes (Signed)
Office Visit Note   Patient: Shane Miles           Date of Birth: July 25, 1966           MRN: [id number] Visit Date: 06/23/2021              Requested by: [name], MD [address] [place],  [place] [address] PCP: [name], MD   Assessment & Plan: Visit Diagnoses:  1. Chronic pain of left knee     Plan: Patient is a 55 year old gentleman and previous history of right knee arthroscopy meniscectomy by another provider 11 years ago.  He did have a shot last summer with Dr. Durward Fortes into the right knee for some knee pain this helped for a couple weeks.  Findings today include differential between recurrent medial meniscus tear and arthritis though his x-ray does not show significant findings.  He will go forward with an MRI and we may call him with the results.  If needed arthroscopy was appropriate he would like to have that prior to the end of the year if possible  Follow-Up Instructions: No follow-ups on file.   Orders:  Orders Placed This Encounter  Procedures   XR KNEE 3 VIEW RIGHT   No orders of the defined types were placed in this encounter.     Procedures: No procedures performed   Clinical Data: No additional findings.   Subjective: Chief Complaint  Patient presents with   Right Knee - Pain  Patient presents today for right knee pain. He said that it started to hurt about 4-6weeks ago. No known injury. His pain reminds him of when he had a meniscus tear in 2010 that required surgery. He said that it hurts medially. He has more pain at night. His range of motion is decreased. No swelling. He takes tylenol as needed.     Review of Systems  All other systems reviewed and are negative.   Objective: Vital Signs: There were no vitals taken for this visit.  Physical Exam Constitutional:      Appearance: Normal appearance.  Pulmonary:     Effort: Pulmonary effort is normal.  Neurological:     Mental Status: He is alert.    Ortho  Exam Examination of his right knee he has no cellulitis no effusion.  Focally tender over the posterior medial joint line.  No tenderness over the lateral joint line.  He has some mild grinding with range of motion.  Good endpoint on anterior draw good medial and lateral stability.  Has some limitations with flexion to about 100 degrees.  After this is somewhat painful reproduced on the medial side of the knee Specialty Comments:  No specialty comments available.  Imaging: No results found.   PMFS History: Patient Active Problem List   Diagnosis Date Noted   Benign prostatic hyperplasia 03/03/2021   Pain in both testicles 03/03/2021   Nocturia 03/03/2021   Osteoarthritis 03/05/2020   Impingement syndrome of right shoulder 02/13/2020   Elevated blood pressure reading 02/03/2020   Past Medical History:  Diagnosis Date   Arthritis     Family History  Problem Relation Age of Onset   Arthritis Mother    Hypertension Mother    Hypertension Father    Colon cancer Neg Hx    Colon polyps Neg Hx    Esophageal cancer Neg Hx    Rectal cancer Neg Hx    Stomach cancer Neg Hx     Past Surgical History:  Procedure Laterality Date   APPENDECTOMY  2003   CARDIOVERSION N/A 05/25/2021   Procedure: CARDIOVERSION;  Surgeon: Fay Records, MD;  Location: [facility] ENDOSCOPY;  Service: Cardiovascular;  Laterality: N/A;   FOREIGN BODY REMOVAL Right    right thing   left knee meniscus  2010   right knee meniscus  2013   Social History   Occupational History   Not on file  Tobacco Use   Smoking status: Never   Smokeless tobacco: Never  Vaping Use   Vaping Use: Never used  Substance and Sexual Activity   Alcohol use: Never   Drug use: Never   Sexual activity: Yes

## 2021-07-07 ENCOUNTER — Other Ambulatory Visit: Payer: Self-pay | Admitting: Nurse Practitioner

## 2021-07-07 DIAGNOSIS — R0683 Snoring: Secondary | ICD-10-CM

## 2021-07-07 DIAGNOSIS — I480 Paroxysmal atrial fibrillation: Secondary | ICD-10-CM

## 2021-07-07 NOTE — Telephone Encounter (Signed)
Received a fax from [facility] denying in lab sleep study. HST ordered. Patient notified of sleep lab denial, HST appointment details.

## 2021-07-12 ENCOUNTER — Other Ambulatory Visit (HOSPITAL_COMMUNITY): Payer: Self-pay

## 2021-07-12 MED ORDER — RIVAROXABAN 20 MG PO TABS: 20.0000 mg | ORAL_TABLET | Freq: Every day | ORAL | 0 refills | Status: DC

## 2021-07-14 ENCOUNTER — Ambulatory Visit: Payer: 59 | Admitting: Urology

## 2021-07-17 ENCOUNTER — Other Ambulatory Visit: Payer: Self-pay

## 2021-07-17 ENCOUNTER — Ambulatory Visit
Admission: RE | Admit: 2021-07-17 | Discharge: 2021-07-17 | Disposition: A | Discharge status: Home / Self Care | Payer: 59 | Source: Ambulatory Visit | Attending: Orthopaedic Surgery | Admitting: Orthopaedic Surgery

## 2021-07-17 DIAGNOSIS — M25561 Pain in right knee: Secondary | ICD-10-CM

## 2021-07-17 DIAGNOSIS — G8929 Other chronic pain: Secondary | ICD-10-CM

## 2021-07-20 ENCOUNTER — Telehealth: Payer: Self-pay

## 2021-07-20 ENCOUNTER — Encounter: Payer: Self-pay | Admitting: Orthopaedic Surgery

## 2021-07-20 ENCOUNTER — Ambulatory Visit (INDEPENDENT_AMBULATORY_CARE_PROVIDER_SITE_OTHER): Payer: 59 | Admitting: Orthopaedic Surgery

## 2021-07-20 ENCOUNTER — Other Ambulatory Visit: Payer: Self-pay

## 2021-07-20 DIAGNOSIS — M1712 Unilateral primary osteoarthritis, left knee: Secondary | ICD-10-CM | POA: Diagnosis not present

## 2021-07-20 NOTE — Progress Notes (Signed)
Office Visit Note   Patient: Shane Miles           Date of Birth: Aug 27, 1965           MRN: [id number] Visit Date: 07/20/2021              Requested by: [name], MD [address] [place],  [place] [address] PCP: [name], MD   Assessment & Plan: Visit Diagnoses: No diagnosis found.  Plan: Mr. Bevins is a pleasant 55 year old gentleman with right knee pain.  Last summer he had pain in the knee which was most coordinated with a arthritic picture.  He was given a steroid injection lasted about 6 to 8 weeks.  He recently saw [place] because he had some focal medial pain that felt very much like the pain he had when he had a meniscus tear.  He presents today and says that is gotten somewhat better.  He is also here to review his MRI his MRI showed chronic degenerative changes of the body of the posterior horn of the medial meniscus as well as general lysed articular cartilage thinning.  Given that he is already had a knee arthroscopy over 10 years ago difficult to say if these changes are consistent with that.  Today his findings seem to be more of an arthritic picture.  He is really not that symptomatic today.  We discussed the possibility of trying a steroid injection today but he does not think he warrants that yet.  We will also place an order for gel injections and if he wishes to go forward with this we can certainly do that would defer an arthroscopy for now  Follow-Up Instructions: No follow-ups on file.   Orders:  No orders of the defined types were placed in this encounter.  No orders of the defined types were placed in this encounter.     Procedures: No procedures performed   Clinical Data: No additional findings.   Subjective: No chief complaint on file. Patient presents today for follow up on his right knee. He had an MRI and is here today for those results.    Review of Systems  All other systems reviewed and are negative.   Objective: Vital Signs: There  were no vitals taken for this visit.  Physical Exam Pleasant 55 year old gentleman pleasant to exam sitting comfortably in a chair Ortho Exam Right knee he has no effusion no inflammation or warmth right now.  He does have some tightness with flexion no specific tenderness may be slightly more on the medial side but not similar to what he had at his last visit good stability with varus and valgus Specialty Comments:  No specialty comments available.  Imaging: No results found.   PMFS History: Patient Active Problem List   Diagnosis Date Noted   Benign prostatic hyperplasia 03/03/2021   Pain in both testicles 03/03/2021   Nocturia 03/03/2021   Osteoarthritis 03/05/2020   Impingement syndrome of right shoulder 02/13/2020   Elevated blood pressure reading 02/03/2020   Past Medical History:  Diagnosis Date   Arthritis     Family History  Problem Relation Age of Onset   Arthritis Mother    Hypertension Mother    Hypertension Father    Colon cancer Neg Hx    Colon polyps Neg Hx    Esophageal cancer Neg Hx    Rectal cancer Neg Hx    Stomach cancer Neg Hx     Past Surgical History:  Procedure  Laterality Date   APPENDECTOMY  2003   CARDIOVERSION N/A 05/25/2021   Procedure: CARDIOVERSION;  Surgeon: Fay Records, MD;  Location: [facility] ENDOSCOPY;  Service: Cardiovascular;  Laterality: N/A;   FOREIGN BODY REMOVAL Right    right thing   left knee meniscus  2010   right knee meniscus  2013   Social History   Occupational History   Not on file  Tobacco Use   Smoking status: Never   Smokeless tobacco: Never  Vaping Use   Vaping Use: Never used  Substance and Sexual Activity   Alcohol use: Never   Drug use: Never   Sexual activity: Yes

## 2021-07-20 NOTE — Telephone Encounter (Signed)
Please precert for right knee visco next year. Thanks! This is Dr.Whitfield's patient.

## 2021-07-21 NOTE — Telephone Encounter (Signed)
Noted  

## 2021-07-27 ENCOUNTER — Ambulatory Visit (HOSPITAL_BASED_OUTPATIENT_CLINIC_OR_DEPARTMENT_OTHER): Payer: 59 | Attending: Nurse Practitioner | Admitting: Cardiovascular Disease

## 2021-07-27 DIAGNOSIS — G4733 Obstructive sleep apnea (adult) (pediatric): Secondary | ICD-10-CM | POA: Diagnosis not present

## 2021-07-27 DIAGNOSIS — I480 Paroxysmal atrial fibrillation: Secondary | ICD-10-CM | POA: Diagnosis present

## 2021-07-27 DIAGNOSIS — R0683 Snoring: Secondary | ICD-10-CM

## 2021-08-20 ENCOUNTER — Encounter (HOSPITAL_BASED_OUTPATIENT_CLINIC_OR_DEPARTMENT_OTHER): Payer: Self-pay | Admitting: Cardiovascular Disease

## 2021-08-20 NOTE — Procedures (Signed)
° ° ° °  Patient Name: Shane Miles, Shane Miles Date: 07/27/2021 Gender: Male D.O.B: [birth year] Age (years): 75 Referring Provider: Sherran Needs Height (inches): 77 Interpreting Physician: Shelva Majestic MD, ABSM Weight (lbs): 240 RPSGT: Jacolyn Reedy BMI: 31 MRN: [id number] Neck Size: <br>  CLINICAL INFORMATION Sleep Study Type: HST  Indication for sleep study: snoring, PAF  Epworth Sleepiness Score: 9  SLEEP STUDY TECHNIQUE A multi-channel overnight portable sleep study was performed. The channels recorded were: nasal airflow, thoracic respiratory movement, and oxygen saturation with a pulse oximetry. Snoring was also monitored.  MEDICATIONS alfuzosin (UROXATRAL) 10 MG 24 hr tablet metoprolol tartrate (LOPRESSOR) 25 MG tablet rivaroxaban (XARELTO) 20 MG TABS tablet  Patient self administered medications include: N/A.  SLEEP ARCHITECTURE Patient was studied for 328.7 minutes. The sleep efficiency was 89.2 % and the patient was supine for 12.2%. The arousal index was 0.0 per hour.  RESPIRATORY PARAMETERS The overall AHI was 24.3 per hour, with a central apnea index of 0 per hour. There is a positionl component with supine sleep AHI 42.0/h versus non-supine sleep AHI 21.9/h.   The oxygen nadir was 87% during sleep.  CARDIAC DATA Mean heart rate during sleep was 59.1 bpm.  IMPRESSIONS - Moderate obstructive sleep apnea occurred during this study (AHI  24.3/h); however, events were severe with supine sleep (AHI 42.0/h). - Mild oxygen desaturation to a nadir of 87%. - Patient snored 60.8% during the sleep.  DIAGNOSIS - Obstructive Sleep Apnea (G47.33)  RECOMMENDATIONS - In this patient with cardiovascular co-morbidities recommend CPAP titration. If unable to obtain an in-lab study, initiate Auto-PAP with EPR of 3 at 6 - 18 cm of water. - Effort should be made to optimize nasal and oropharyngeal patency. - Positional therapy avoiding supine position during sleep. -  Avoid alcohol, sedatives and other CNS depressants that may worsen sleep apnea and disrupt normal sleep architecture. - Sleep hygiene should be reviewed to assess factors that may improve sleep quality. - Weight management (BMI31) and regular exercise should be initiated or continued. - Recommend a download and sleep clinic evaluation after one maonth of therapy.   [Electronically signed] 08/20/2021 08:32 AM  Shelva Majestic MD, [facility], [place], American Board of Sleep Medicine   NPI: [id number]  [place] PH: [phone]   FX: [phone] [place]

## 2021-08-25 ENCOUNTER — Telehealth: Payer: Self-pay | Admitting: *Deleted

## 2021-08-25 NOTE — Telephone Encounter (Signed)
Left message to return a call to discuss HST results and recommendations. 

## 2021-08-25 NOTE — Telephone Encounter (Signed)
-----   Message from Troy Sine, MD sent at 08/20/2021  8:37 AM EST ----- Mariann Laster, please notify pt and set up for CPAP titration or Auto-PAP

## 2021-09-08 ENCOUNTER — Other Ambulatory Visit: Payer: Self-pay | Admitting: Cardiovascular Disease

## 2021-09-08 DIAGNOSIS — G4733 Obstructive sleep apnea (adult) (pediatric): Secondary | ICD-10-CM

## 2021-09-08 NOTE — Telephone Encounter (Signed)
Patient notified of HST results and recommendations. He agrees to proceed with CPAP titration sleep study.

## 2021-09-13 ENCOUNTER — Telehealth: Payer: Self-pay | Admitting: *Deleted

## 2021-09-13 NOTE — Telephone Encounter (Signed)
Prior Authorization for CPAP titration sent to [facility] via web portal. Tracking Number [id number].

## 2021-10-20 ENCOUNTER — Other Ambulatory Visit: Payer: Self-pay | Admitting: Nurse Practitioner

## 2021-10-25 ENCOUNTER — Other Ambulatory Visit (HOSPITAL_COMMUNITY): Payer: Self-pay | Admitting: *Deleted

## 2021-10-25 MED ORDER — METOPROLOL TARTRATE 25 MG PO TABS: 25.0000 mg | ORAL_TABLET | Freq: Two times a day (BID) | ORAL | 3 refills | Status: DC

## 2021-11-02 ENCOUNTER — Encounter: Payer: Self-pay | Admitting: Family Medicine

## 2021-11-02 ENCOUNTER — Ambulatory Visit (INDEPENDENT_AMBULATORY_CARE_PROVIDER_SITE_OTHER): Payer: 59 | Admitting: Family Medicine

## 2021-11-02 VITALS — BP 168/96 | HR 60 | Temp 98.1°F | Ht 74.0 in | Wt 244.2 lb

## 2021-11-02 DIAGNOSIS — I4891 Unspecified atrial fibrillation: Secondary | ICD-10-CM

## 2021-11-02 DIAGNOSIS — Z23 Encounter for immunization: Secondary | ICD-10-CM | POA: Diagnosis not present

## 2021-11-02 DIAGNOSIS — R351 Nocturia: Secondary | ICD-10-CM

## 2021-11-02 DIAGNOSIS — R030 Elevated blood-pressure reading, without diagnosis of hypertension: Secondary | ICD-10-CM

## 2021-11-02 DIAGNOSIS — Z0001 Encounter for general adult medical examination with abnormal findings: Secondary | ICD-10-CM | POA: Diagnosis not present

## 2021-11-02 DIAGNOSIS — N400 Enlarged prostate without lower urinary tract symptoms: Secondary | ICD-10-CM | POA: Diagnosis not present

## 2021-11-02 DIAGNOSIS — M1990 Unspecified osteoarthritis, unspecified site: Secondary | ICD-10-CM | POA: Diagnosis not present

## 2021-11-02 DIAGNOSIS — Z13220 Encounter for screening for lipoid disorders: Secondary | ICD-10-CM | POA: Diagnosis not present

## 2021-11-02 DIAGNOSIS — R739 Hyperglycemia, unspecified: Secondary | ICD-10-CM

## 2021-11-02 LAB — PSA: PSA: 1.32 ng/mL (ref 0.10–4.00)

## 2021-11-02 LAB — COMPREHENSIVE METABOLIC PANEL
ALT: 19 U/L (ref 0–53)
AST: 18 U/L (ref 0–37)
Albumin: 4.5 g/dL (ref 3.5–5.2)
Alkaline Phosphatase: 41 U/L (ref 39–117)
BUN: 15 mg/dL (ref 6–23)
CO2: 30 mEq/L (ref 19–32)
Calcium: 10.1 mg/dL (ref 8.4–10.5)
Chloride: 104 mEq/L (ref 96–112)
Creatinine, Ser: 0.95 mg/dL (ref 0.40–1.50)
GFR: 88.87 mL/min (ref >60.00)
Glucose, Bld: 106 mg/dL — ABNORMAL HIGH (ref 70–99)
Potassium: 4.4 mEq/L (ref 3.5–5.1)
Sodium: 140 mEq/L (ref 135–145)
Total Bilirubin: 0.6 mg/dL (ref 0.2–1.2)
Total Protein: 6.7 g/dL (ref 6.0–8.3)

## 2021-11-02 LAB — CBC
HCT: 42.5 % (ref 39.0–52.0)
Hemoglobin: 14.7 g/dL (ref 13.0–17.0)
MCHC: 34.5 g/dL (ref 30.0–36.0)
MCV: 90.6 fl (ref 78.0–100.0)
Platelets: 208 10*3/uL (ref 150.0–400.0)
RBC: 4.69 Mil/uL (ref 4.22–5.81)
RDW: 13 % (ref 11.5–15.5)
WBC: 5 10*3/uL (ref 4.0–10.5)

## 2021-11-02 LAB — LIPID PANEL
Cholesterol: 164 mg/dL (ref 0–200)
HDL: 43.5 mg/dL (ref >39.00)
LDL Cholesterol: 99 mg/dL (ref 0–99)
NonHDL: 120.02
Total CHOL/HDL Ratio: 4
Triglycerides: 104 mg/dL (ref 0.0–149.0)
VLDL: 20.8 mg/dL (ref 0.0–40.0)

## 2021-11-02 LAB — HEMOGLOBIN A1C: Hgb A1c MFr Bld: 5.4 % (ref 4.6–6.5)

## 2021-11-02 LAB — TSH: TSH: 1.36 u[IU]/mL (ref 0.35–5.50)

## 2021-11-02 MED ORDER — FINASTERIDE 5 MG PO TABS: 5.0000 mg | ORAL_TABLET | Freq: Every day | ORAL | 3 refills | Status: DC

## 2021-11-02 NOTE — Assessment & Plan Note (Signed)
Above goal today though typically well controlled at home.  Continue home monitoring.  We discussed lifestyle modifications including salt avoidance and regular exercise.  He will let me know if persistently elevated at home. ?

## 2021-11-02 NOTE — Assessment & Plan Note (Signed)
Continue management per orthopedics. 

## 2021-11-02 NOTE — Patient Instructions (Signed)
It was very nice to see you today! ? ?We we will check blood work today. ? ?We will give your shingles vaccine today. ? ?Please try the finasteride to help with your prostate symptoms.  Let me know how this is working for you in a few months. ? ?Please keep your blood pressure and let me know if it is persistently 140/90 or higher. ? ?I will see you back in year for your next physical.  Come back sooner if needed. ? ?Take care, ?Dr Jerline Pain ? ?PLEASE NOTE: ? ?If you had any lab tests please let us know if you have not heard back within a few days. You may see your results on mychart before we have a chance to review them but we will give you a call once they are reviewed by [place]. If we ordered any referrals today, please let us know if you have not heard from their office within the next week.  ? ?Please try these tips to maintain a healthy lifestyle: ? ?Eat at least 3 REAL meals and 1-2 snacks per day.  Aim for no more than 5 hours between eating.  If you eat breakfast, please do so within one hour of getting up.  ? ?Each meal should contain half fruits/vegetables, one quarter protein, and one quarter carbs (no bigger than a computer mouse) ? ?Cut down on sweet beverages. This includes juice, soda, and sweet tea.  ? ?Drink at least 1 glass of water with each meal and aim for at least 8 glasses per day ? ?Exercise at least 150 minutes every week.   ? ?Preventive Care 51-44 Years Old, Male ?Preventive care refers to lifestyle choices and visits with your health care provider that can promote health and wellness. Preventive care visits are also called wellness exams. ?What can I expect for my preventive care visit? ?Counseling ?During your preventive care visit, your health care provider may ask about your: ?Medical history, including: ?Past medical problems. ?Family medical history. ?Current health, including: ?Emotional well-being. ?Home life and relationship well-being. ?Sexual activity. ?Lifestyle,  including: ?Alcohol, nicotine or tobacco, and drug use. ?Access to firearms. ?Diet, exercise, and sleep habits. ?Safety issues such as seatbelt and bike helmet use. ?Sunscreen use. ?Work and work Statistician. ?Physical exam ?Your health care provider will check your: ?Height and weight. These may be used to calculate your BMI (body mass index). BMI is a measurement that tells if you are at a healthy weight. ?Waist circumference. This measures the distance around your waistline. This measurement also tells if you are at a healthy weight and may help predict your risk of certain diseases, such as type 2 diabetes and high blood pressure. ?Heart rate and blood pressure. ?Body temperature. ?Skin for abnormal spots. ?What immunizations do I need? ?Vaccines are usually given at various ages, according to a schedule. Your health care provider will recommend vaccines for you based on your age, medical history, and lifestyle or other factors, such as travel or where you work. ?What tests do I need? ?Screening ?Your health care provider may recommend screening tests for certain conditions. This may include: ?Lipid and cholesterol levels. ?Diabetes screening. This is done by checking your blood sugar (glucose) after you have not eaten for a while (fasting). ?Hepatitis B test. ?Hepatitis C test. ?HIV (human immunodeficiency virus) test. ?STI (sexually transmitted infection) testing, if you are at risk. ?Lung cancer screening. ?Prostate cancer screening. ?Colorectal cancer screening. ?Talk with your health care provider about your test results, treatment options,  and if necessary, the need for more tests. ?Follow these instructions at home: ?Eating and drinking ? ?Eat a diet that includes fresh fruits and vegetables, whole grains, lean protein, and low-fat dairy products. ?Take vitamin and mineral supplements as recommended by your health care provider. ?Do not drink alcohol if your health care provider tells you not to  drink. ?If you drink alcohol: ?Limit how much you have to 0-2 drinks a day. ?Know how much alcohol is in your drink. In the [place] one drink equals one 12 oz bottle of beer (355 mL), one 5 oz glass of wine (148 mL), or one 1? oz glass of hard liquor (44 mL). ?Lifestyle ?Brush your teeth every morning and night with fluoride toothpaste. Floss one time each day. ?Exercise for at least 30 minutes 5 or more days each week. ?Do not use any products that contain nicotine or tobacco. These products include cigarettes, chewing tobacco, and vaping devices, such as e-cigarettes. If you need help quitting, ask your health care provider. ?Do not use drugs. ?If you are sexually active, practice safe sex. Use a condom or other form of protection to prevent STIs. ?Take aspirin only as told by your health care provider. Make sure that you understand how much to take and what form to take. Work with your health care provider to find out whether it is safe and beneficial for you to take aspirin daily. ?Find healthy ways to manage stress, such as: ?Meditation, yoga, or listening to music. ?Journaling. ?Talking to a trusted person. ?Spending time with friends and family. ?Minimize exposure to UV radiation to reduce your risk of skin cancer. ?Safety ?Always wear your seat belt while driving or riding in a vehicle. ?Do not drive: ?If you have been drinking alcohol. Do not ride with someone who has been drinking. ?When you are tired or distracted. ?While texting. ?If you have been using any mind-altering substances or drugs. ?Wear a helmet and other protective equipment during sports activities. ?If you have firearms in your house, make sure you follow all gun safety procedures. ?What's next? ?Go to your health care provider once a year for an annual wellness visit. ?Ask your health care provider how often you should have your eyes and teeth checked. ?Stay up to date on all vaccines. ?This information is not intended to replace advice  given to you by your health care provider. Make sure you discuss any questions you have with your health care provider. ?Document Revised: 01/20/2021 Document Reviewed: 01/20/2021 ?Elsevier Patient Education ? [place]. ? ?

## 2021-11-02 NOTE — Progress Notes (Signed)
? ?Chief Complaint:  ?Shane Miles is a 56 y.o. male who presents today for his annual comprehensive physical exam.   ? ?Assessment/Plan:  ?Chronic Problems Addressed Today: ?Benign prostatic hyperplasia ?Has followed with urology for this.  Still has ongoing issues despite use of alfuzosin 10 mg daily.  We discussed treatment options.  We will try finasteride 5 mg daily.  We discussed potential side effects with this medication.  He will follow-up with me in a few months via MyChart.  Check PSA today. ? ?Osteoarthritis ?Continue management per orthopedics.  ? ?Elevated blood pressure reading ?Above goal today though typically well controlled at home.  Continue home monitoring.  We discussed lifestyle modifications including salt avoidance and regular exercise.  He will let me know if persistently elevated at home. ? ?Atrial fibrillation ([place]) ?Sinus rhythm today.  He is rate controlled on metoprolol tartrate 25 mg twice daily.  Continue management per cardiology. ? ?Preventative Healthcare: ?Will get blood work donee today. UTD on colonoscopy. Shingle vaccine given today. UTD on other vaccines and screenings. ? ?Patient Counseling(The following topics were reviewed and/or handout was given): ? -Nutrition: Stressed importance of moderation in sodium/caffeine intake, saturated fat and cholesterol, caloric balance, sufficient intake of fresh fruits, vegetables, and fiber. ? -Stressed the importance of regular exercise.  ? -Substance Abuse: Discussed cessation/primary prevention of tobacco, alcohol, or other drug use; driving or other dangerous activities under the influence; availability of treatment for abuse.  ? -Injury prevention: Discussed safety belts, safety helmets, smoke detector, smoking near bedding or upholstery.  ? -Sexuality: Discussed sexually transmitted diseases, partner selection, use of condoms, avoidance of unintended pregnancy and contraceptive alternatives.  ? -Dental health: Discussed importance  of regular tooth brushing, flossing, and dental visits. ? -Health maintenance and immunizations reviewed. Please refer to Health maintenance section. ? ?Return to care in 1 year for next preventative visit.  ? ?  ?Subjective:  ?HPI: ? ?He has no acute complaints today.  ? ?Since our last visit patient was diagnosed with atrial fibrillation during screening colonoscopy.  Followed up with cardiology and underwent cardioversion.  He has not had any issues since cardioversion last year.  He is currently taking Metoprolol 25 mg twice daily. He is tolerating his medication. No side effects.  ? ?He still have issue with enlarged prostate. He notes he has to go to restroom every few hours at night. He would like to start on medication for this issue. He follows up with urologist.  ? ?Lifestyle ?Diet: Balanced: trying to cut down.on sugar. ?Exercise: Likes to walk.  ? ? ?  11/02/2021  ?  8:43 AM  ?Depression screen PHQ 2/9  ?Decreased Interest 0  ?Down, Depressed, Hopeless 0  ?PHQ - 2 Score 0  ? ? ?Health Maintenance Due  ?Topic Date Due  ? HIV Screening  Never done  ? Hepatitis C Screening  Never done  ? TETANUS/TDAP  Never done  ? Zoster Vaccines- Shingrix (1 of 2) Never done  ?  ? ?ROS: Per HPI, otherwise a complete review of systems was negative.  ? ?PMH: ? ?The following were reviewed and entered/updated in epic: ?Past Medical History:  ?Diagnosis Date  ? Arthritis   ? ?Patient Active Problem List  ? Diagnosis Date Noted  ? Atrial fibrillation ([place]) 11/02/2021  ? Benign prostatic hyperplasia 03/03/2021  ? Osteoarthritis 03/05/2020  ? Elevated blood pressure reading 02/03/2020  ? ?Past Surgical History:  ?Procedure Laterality Date  ? APPENDECTOMY  2003  ? CARDIOVERSION N/A  05/25/2021  ? Procedure: CARDIOVERSION;  Surgeon: Fay Records, MD;  Location: [facility];  Service: Cardiovascular;  Laterality: N/A;  ? FOREIGN BODY REMOVAL Right   ? right thing  ? left knee meniscus  2010  ? right knee meniscus  2013  ? ? ?Family  History  ?Problem Relation Age of Onset  ? Arthritis Mother   ? Hypertension Mother   ? Hypertension Father   ? Colon cancer Neg Hx   ? Colon polyps Neg Hx   ? Esophageal cancer Neg Hx   ? Rectal cancer Neg Hx   ? Stomach cancer Neg Hx   ? ? ?Medications- reviewed and updated ?Current Outpatient Medications  ?Medication Sig Dispense Refill  ? Acetaminophen (TYLENOL PO) Take 500 mg by mouth as needed.    ? alfuzosin (UROXATRAL) 10 MG 24 hr tablet TAKE 1 TABLET BY MOUTH EVERYDAY AT BEDTIME 90 tablet 4  ? finasteride (PROSCAR) 5 MG tablet Take 1 tablet (5 mg total) by mouth daily. 90 tablet 3  ? metoprolol tartrate (LOPRESSOR) 25 MG tablet Take 1 tablet (25 mg total) by mouth 2 (two) times daily. 180 tablet 3  ? ?No current facility-administered medications for this visit.  ? ? ?Allergies-reviewed and updated ?No Known Allergies ? ?Social History  ? ?Socioeconomic History  ? Marital status: Married  ?  Spouse name: Not on file  ? Number of children: Not on file  ? Years of education: Not on file  ? Highest education level: Not on file  ?Occupational History  ? Not on file  ?Tobacco Use  ? Smoking status: Never  ? Smokeless tobacco: Never  ?Vaping Use  ? Vaping Use: Never used  ?Substance and Sexual Activity  ? Alcohol use: Never  ? Drug use: Never  ? Sexual activity: Yes  ?Other Topics Concern  ? Not on file  ?Social History Narrative  ? Married  ? Works in [employer]  ? ?Social Determinants of Health  ? ?Financial Resource Strain: Not on file  ?Food Insecurity: Not on file  ?Transportation Needs: Not on file  ?Physical Activity: Not on file  ?Stress: Not on file  ?Social Connections: Not on file  ? ?   ?  ?Objective:  ?Physical Exam: ?BP (!) 168/96 (BP Location: Left Arm)   Pulse 60   Temp 98.1 ?F (36.7 ?C) (Temporal)   Ht '6\' 2"'$  (1.88 m)   Wt 244 lb 3.2 oz (110.8 kg)   SpO2 98%   BMI 31.35 kg/m?   ?Body mass index is 31.35 kg/m?. ?Wt Readings from Last 3 Encounters:  ?11/02/21 244 lb 3.2 oz (110.8 kg)  ?07/27/21  240 lb (108.9 kg)  ?06/01/21 240 lb (108.9 kg)  ? ?Gen: NAD, resting comfortably ?HEENT: TMs normal bilaterally. OP clear. No thyromegaly noted.  ?CV: RRR with no murmurs appreciated ?Pulm: NWOB, CTAB with no crackles, wheezes, or rhonchi ?GI: Normal bowel sounds present. Soft, Nontender, Nondistended. ?MSK: no edema, cyanosis, or clubbing noted ?Skin: warm, dry ?Neuro: CN2-12 grossly intact. Strength 5/5 in upper and lower extremities. Reflexes symmetric and intact bilaterally.  ?Psych: Normal affect and thought content ?   ? ? ?I,Savera Zaman,acting as a Education administrator for Dimas Chyle, MD.,have documented all relevant documentation on the behalf of Dimas Chyle, MD,as directed by  Dimas Chyle, MD while in the presence of Dimas Chyle, MD.  ? ?I, Dimas Chyle, MD, have reviewed all documentation for this visit. The documentation on 11/02/21 for the exam, diagnosis, procedures, and orders  are all accurate and complete. ? ?Algis Greenhouse. Jerline Pain, MD ?11/02/2021 9:05 AM  ? ?

## 2021-11-02 NOTE — Assessment & Plan Note (Signed)
Sinus rhythm today.  He is rate controlled on metoprolol tartrate 25 mg twice daily.  Continue management per cardiology. ?

## 2021-11-02 NOTE — Assessment & Plan Note (Signed)
Has followed with urology for this.  Still has ongoing issues despite use of alfuzosin 10 mg daily.  We discussed treatment options.  We will try finasteride 5 mg daily.  We discussed potential side effects with this medication.  He will follow-up with me in a few months via MyChart.  Check PSA today. ?

## 2021-11-04 NOTE — Progress Notes (Signed)
Please inform patient of the following: ? ?Good news! Labs are all NORMAL. We can recheck in a year. ? ?Shane Miles. Jerline Pain, MD ?11/04/2021 3:29 PM  ?

## 2021-11-05 ENCOUNTER — Telehealth: Payer: Self-pay | Admitting: Family Medicine

## 2021-11-05 NOTE — Telephone Encounter (Signed)
Pt is returning a call to our office regarding labs. Ask for a call back ?

## 2021-11-05 NOTE — Telephone Encounter (Signed)
See lab note.  

## 2022-03-03 ENCOUNTER — Ambulatory Visit: Payer: 59

## 2022-03-10 ENCOUNTER — Ambulatory Visit (INDEPENDENT_AMBULATORY_CARE_PROVIDER_SITE_OTHER): Payer: 59 | Admitting: *Deleted

## 2022-03-10 DIAGNOSIS — Z23 Encounter for immunization: Secondary | ICD-10-CM

## 2022-03-10 NOTE — Progress Notes (Signed)
I have reviewed the patient's encounter and agree with the documentation.  Shane Miles. Jerline Pain, MD 03/10/2022 9:55 AM

## 2022-03-10 NOTE — Progress Notes (Signed)
Per orders of Dr. Jerline Pain, injection of 2nd Shingles vaccine given in left deltoid per patient preference by Shane Miles, CMA. Patient tolerated injection well.

## 2022-05-02 ENCOUNTER — Encounter: Payer: Self-pay | Admitting: *Deleted

## 2022-05-19 ENCOUNTER — Encounter: Payer: Self-pay | Admitting: Cardiovascular Disease

## 2022-05-19 ENCOUNTER — Ambulatory Visit: Payer: 59 | Attending: Cardiovascular Disease | Admitting: Cardiovascular Disease

## 2022-05-19 VITALS — BP 139/77 | HR 92 | Ht 74.0 in | Wt 235.6 lb

## 2022-05-19 DIAGNOSIS — G4733 Obstructive sleep apnea (adult) (pediatric): Secondary | ICD-10-CM | POA: Diagnosis not present

## 2022-05-19 DIAGNOSIS — R0683 Snoring: Secondary | ICD-10-CM | POA: Diagnosis not present

## 2022-05-19 DIAGNOSIS — I480 Paroxysmal atrial fibrillation: Secondary | ICD-10-CM | POA: Diagnosis not present

## 2022-05-19 DIAGNOSIS — E669 Obesity, unspecified: Secondary | ICD-10-CM | POA: Diagnosis not present

## 2022-05-19 NOTE — Progress Notes (Signed)
Cardiology Office Note    Date:  05/21/2022   ID:  Shane Miles, DOB 1965-10-30, MRN [id number]  PCP:  [name], MD  Cardiologist:  Shelva Majestic, MD(sleep);   New sleep consult initially referred by Roderic Palau, NP for evaluation of possible obstructive sleep apnea  History of Present Illness:  Shane Miles is a 56 y.o. male who is followed by Dr. Dimas Chyle for primary care and has been evaluated by Roderic Palau in atrial fibrillation clinic.  Mr. Fiorella denied any known prior cardiac history but on June 05, 2020 when he presented for colonoscopy he was found to be in atrial fibrillation.  He was sent to the emergency room and was sent home on metoprolol and was started on Xarelto.  His CHA2DS2-VASc score was 0.  He was initially seen by Roderic Palau, NP in the atrial fibrillation clinic on June 10 2020.  A subsequent Zio patch monitor demonstrated atrial fibrillation but when he was reevaluated on July 08, 2020 he was in sinus rhythm.  With his CHA2DS2-VASc score of 0 and no need for cardioversion he was advised that he can discontinue anticoagulation.  A 2D echo Doppler study in July 27, 2020 showed normal EF at 60 to 65% with grade 1 diastolic dysfunction.  Valves were normal.  He has been evaluated on subsequent occasions by Roderic Palau and had developed recurrent atrial fibrillation after attending a retirement party and had excess beer and was seen on May 04, 2021 back in atrial fibrillation.  He was restarted on anticoagulation and metoprolol.  He underwent successful DC cardioversion on May 25, 2021 and has been maintaining sinus rhythm ever since.  After 4 weeks of anticoagulation following cardioversion he was advised to discontinue anticoagulation.  Due to concerns for potential obstructive sleep apnea with symptoms of snoring, daytime sleepiness, nonrestorative sleep, he ultimately was referred for a home sleep study which was done on July 27, 2021.  He was found to have moderate overall sleep apnea with an AHI of 24.3/h however sleep apnea was severe with supine sleep with an AHI of 42.0/h.  He snored for 60.8% of the night.  There was mild oxygen desaturation to a nadir of 87%.  Due to supply chain issues, he did not receive a new ResMed AirSense 11 AutoSet CPAP unit until March 01, 2022.  Subsequent download from July 25 through March 30, 2022 confirms compliance with 100% use.  Average use duration met compliance standards at 5 hours and 50 minutes but was still suboptimal.  Typically he goes to bed between 9 and 930 and wakes up at 530 but had been putting the mask back on after he was come back from going to the bathroom.  At his pressure of 6 to 18 cm of water, AHI is excellent at 0.3.  His 95th percentile pressure was 8.9 with maximum average pressure 10.1.  A subsequent download from September 12 through May 18, 2022 has now shown reduced compliance with only 57% of usage days and usage just under 3 hours.  When used again AHI was excellent at 0.6.  He is unaware of any breakthrough atrial fibrillation.  When using CPAP he is unaware of any snoring.  An Epworth Sleepiness Scale score was calculated in the office today and this endorsed at 8.  He denies any awareness of bruxism, restless legs, hypnopompic or hypnagogic hallucinations or cataplectic events.  He presents for his initial sleep consultation and evaluation.    Past  Medical History:  Diagnosis Date   Arthritis     Past Surgical History:  Procedure Laterality Date   APPENDECTOMY  2003   CARDIOVERSION N/A 05/25/2021   Procedure: CARDIOVERSION;  Surgeon: Fay Records, MD;  Location: [facility] ENDOSCOPY;  Service: Cardiovascular;  Laterality: N/A;   FOREIGN BODY REMOVAL Right    right thing   left knee meniscus  2010   right knee meniscus  2013    Current Medications: Outpatient Medications Prior to Visit  Medication Sig Dispense Refill   finasteride (PROSCAR) 5 MG  tablet Take 1 tablet (5 mg total) by mouth daily. 90 tablet 3   metoprolol tartrate (LOPRESSOR) 25 MG tablet Take 1 tablet (25 mg total) by mouth 2 (two) times daily. 180 tablet 3   Acetaminophen (TYLENOL PO) Take 500 mg by mouth as needed. (Patient not taking: Reported on 05/19/2022)     alfuzosin (UROXATRAL) 10 MG 24 hr tablet TAKE 1 TABLET BY MOUTH EVERYDAY AT BEDTIME (Patient not taking: Reported on 05/19/2022) 90 tablet 4   No facility-administered medications prior to visit.     Allergies:   Patient has no known allergies.   Social History   Socioeconomic History   Marital status: Married    Spouse name: Not on file   Number of children: Not on file   Years of education: Not on file   Highest education level: Not on file  Occupational History   Not on file  Tobacco Use   Smoking status: Never   Smokeless tobacco: Never  Vaping Use   Vaping Use: Never used  Substance and Sexual Activity   Alcohol use: Never   Drug use: Never   Sexual activity: Yes  Other Topics Concern   Not on file  Social History Narrative   Married   Works in [place] Strain: Not on file  Food Insecurity: Not on file  Transportation Needs: Not on file  Physical Activity: Not on file  Stress: Not on file  Social Connections: Not on file    Socially he was born in [place], [place].  He is married for 27 years.  He works for [employer].  He exercises regularly and walks approximately 2 miles 4 to 5 days/week.  Family History:  The patient's family history includes Arthritis in his mother; Hypertension in his father and mother.  His father is alive at 6 and has undergone valve replacement.  Mother is 38 and relatively healthy.  He has 1 brother and 1 sister.  ROS General: Negative; No fevers, chills, or night sweats;  HEENT: Negative; No changes in vision or hearing, sinus congestion, difficulty  swallowing Pulmonary: Negative; No cough, wheezing, shortness of breath, hemoptysis Cardiovascular: PAF GI: Negative; No nausea, vomiting, diarrhea, or abdominal pain GU: Negative; No dysuria, hematuria, or difficulty voiding Musculoskeletal: Negative; no myalgias, joint pain, or weakness Hematologic/Oncology: Negative; no easy bruising, bleeding Endocrine: Negative; no heat/cold intolerance; no diabetes Neuro: Negative; no changes in balance, headaches Skin: Negative; No rashes or skin lesions Psychiatric: Negative; No behavioral problems, depression Sleep: See HPI Epworth Sleepiness Scale score calculated in the office today endorsed at 8 shown below:  Epworth Sleepiness Scale: Situation   Chance of Dozing/Sleeping (0 = never , 1 = slight chance , 2 = moderate chance , 3 = high chance )   sitting and reading 2   watching TV 2   sitting inactive in a public place  0   being a passenger in a motor vehicle for an hour or more 0   lying down in the afternoon 3   sitting and talking to someone 0   sitting quietly after lunch (no alcohol) 1   while stopped for a few minutes in traffic as the driver 0   Total Score  8    Other comprehensive 14 point system review is negative.   PHYSICAL EXAM:   VS:  BP 139/77 (BP Location: Left Arm, Patient Position: Sitting, Cuff Size: Large)   Pulse 92   Ht '6\' 2"'  (1.88 m)   Wt 235 lb 9.6 oz (106.9 kg)   SpO2 96%   BMI 30.25 kg/m     Repeat blood pressure by me was 140/80.  He admits to a 15 pound weight loss over the past 2 months.  Wt Readings from Last 3 Encounters:  05/19/22 235 lb 9.6 oz (106.9 kg)  11/02/21 244 lb 3.2 oz (110.8 kg)  07/27/21 240 lb (108.9 kg)    General: Alert, oriented, no distress.  Skin: normal turgor, no rashes, warm and dry HEENT: Normocephalic, atraumatic. Pupils equal round and reactive to light; sclera anicteric; extraocular muscles intact; Nose without nasal septal hypertrophy Mouth/Parynx benign;  Mallinpatti scale 2 Neck: No JVD, no carotid bruits; normal carotid upstroke Lungs: clear to ausculatation and percussion; no wheezing or rales Chest wall: without tenderness to palpitation Heart: PMI not displaced, RRR, s1 s2 normal, 1/6 systolic murmur, no diastolic murmur, no rubs, gallops, thrills, or heaves Abdomen: soft, nontender; no hepatosplenomehaly, BS+; abdominal aorta nontender and not dilated by palpation. Back: no CVA tenderness Pulses 2+ Musculoskeletal: full range of motion, normal strength, no joint deformities Extremities: no clubbing cyanosis or edema, Homan's sign negative  Neurologic: grossly nonfocal; Cranial nerves grossly wnl Psychologic: Normal mood and affect   Studies/Labs Reviewed:   May 19, 2022 ECG (independently read by me): NSR at 92, LAE, QTc 474 msec  Recent Labs:    Latest Ref Rng & Units 11/02/2021    9:10 AM 05/13/2021    5:14 PM 06/05/2020   11:41 AM  BMP  Glucose 70 - 99 mg/dL 106  116  99   BUN 6 - 23 mg/dL '15  21  12   ' Creatinine 0.40 - 1.50 mg/dL 0.95  0.93  1.03   Sodium 135 - 145 mEq/L 140  139  139   Potassium 3.5 - 5.1 mEq/L 4.4  3.7  4.0   Chloride 96 - 112 mEq/L 104  104  105   CO2 19 - 32 mEq/L '30  25  23   ' Calcium 8.4 - 10.5 mg/dL 10.1  9.4  9.7         Latest Ref Rng & Units 11/02/2021    9:10 AM 02/03/2020    2:19 PM  Hepatic Function  Total Protein 6.0 - 8.3 g/dL 6.7  7.2   Albumin 3.5 - 5.2 g/dL 4.5  4.7   AST 0 - 37 U/L 18  17   ALT 0 - 53 U/L 19  18   Alk Phosphatase 39 - 117 U/L 41  49   Total Bilirubin 0.2 - 1.2 mg/dL 0.6  0.8        Latest Ref Rng & Units 11/02/2021    9:10 AM 05/13/2021    5:14 PM 06/05/2020   11:41 AM  CBC  WBC 4.0 - 10.5 K/uL 5.0  6.5  7.2   Hemoglobin 13.0 - 17.0 g/dL  14.7  14.7  16.1   Hematocrit 39.0 - 52.0 % 42.5  42.8  46.2   Platelets 150.0 - 400.0 K/uL 208.0  225  244    Lab Results  Component Value Date   MCV 90.6 11/02/2021   MCV 91.8 05/13/2021   MCV 89.7 06/05/2020    Lab Results  Component Value Date   TSH 1.36 11/02/2021   Lab Results  Component Value Date   HGBA1C 5.4 11/02/2021     BNP No results found for: "BNP"  ProBNP No results found for: "PROBNP"   Lipid Panel     Component Value Date/Time   CHOL 164 11/02/2021 0910   TRIG 104.0 11/02/2021 0910   HDL 43.50 11/02/2021 0910   CHOLHDL 4 11/02/2021 0910   VLDL 20.8 11/02/2021 0910   LDLCALC 99 11/02/2021 0910     RADIOLOGY: No results found.   Additional studies/ records that were reviewed today include:   ECHO: 12/20.2021  1. Left ventricular ejection fraction, by estimation, is 60 to 65%. The  left ventricle has normal function. The left ventricle has no regional  wall motion abnormalities. Left ventricular diastolic parameters are  consistent with Grade I diastolic  dysfunction (impaired relaxation).   2. Right ventricular systolic function is normal. The right ventricular  size is normal. Tricuspid regurgitation signal is inadequate for assessing  PA pressure.   3. The mitral valve is grossly normal. Trivial mitral valve  regurgitation.   4. The aortic valve is tricuspid. Aortic valve regurgitation is not  visualized.   5. The inferior vena cava is normal in size with greater than 50%  respiratory variability, suggesting right atrial pressure of 3 mmHg.    Patient Name: Tillman, Kazmierski Date: 07/27/2021 Gender: Male D.O.B: 20-Nov-1965 Age (years): 66 Referring Provider: Sherran Needs Height (inches): 21 Interpreting Physician: Shelva Majestic MD, ABSM Weight (lbs): 240 RPSGT: Jacolyn Reedy BMI: 31 MRN: [id number] Neck Size: <br>   CLINICAL INFORMATION Sleep Study Type: HST   Indication for sleep study: snoring, PAF   Epworth Sleepiness Score: 9   SLEEP STUDY TECHNIQUE A multi-channel overnight portable sleep study was performed. The channels recorded were: nasal airflow, thoracic respiratory movement, and oxygen saturation with a pulse  oximetry. Snoring was also monitored.   MEDICATIONS alfuzosin (UROXATRAL) 10 MG 24 hr tablet metoprolol tartrate (LOPRESSOR) 25 MG tablet rivaroxaban (XARELTO) 20 MG TABS tablet  Patient self administered medications include: N/A.   SLEEP ARCHITECTURE Patient was studied for 328.7 minutes. The sleep efficiency was 89.2 % and the patient was supine for 12.2%. The arousal index was 0.0 per hour.   RESPIRATORY PARAMETERS The overall AHI was 24.3 per hour, with a central apnea index of 0 per hour. There is a positionl component with supine sleep AHI 42.0/h versus non-supine sleep AHI 21.9/h.    The oxygen nadir was 87% during sleep.   CARDIAC DATA Mean heart rate during sleep was 59.1 bpm.   IMPRESSIONS - Moderate obstructive sleep apnea occurred during this study (AHI  24.3/h); however, events were severe with supine sleep (AHI 42.0/h). - Mild oxygen desaturation to a nadir of 87%. - Patient snored 60.8% during the sleep.   DIAGNOSIS - Obstructive Sleep Apnea (G47.33)   RECOMMENDATIONS - In this patient with cardiovascular co-morbidities recommend CPAP titration. If unable to obtain an in-lab study, initiate Auto-PAP with EPR of 3 at 6 - 18 cm of water. - Effort should be made to optimize nasal and oropharyngeal patency. - Positional  therapy avoiding supine position during sleep. - Avoid alcohol, sedatives and other CNS depressants that may worsen sleep apnea and disrupt normal sleep architecture. - Sleep hygiene should be reviewed to assess factors that may improve sleep quality. - Weight management (BMI31) and regular exercise should be initiated or continued. - Recommend a download and sleep clinic evaluation after one maonth of therapy.   ASSESSMENT:    1. Obstructive sleep apnea (adult) (pediatric)   2. Snoring   3. Paroxysmal atrial fibrillation (HCC)   4. Mild obesity     PLAN:  Mr. Shin Lamour is a 56 year old gentleman who developed new onset atrial fibrillation  in 2021 at which time he was asymptomatic which was discovered when he presented to undergo colonoscopy.  He was treated with metoprolol with restoration of sinus rhythm and in September 2022 developed a recurrent episode of atrial fibrillation and subsequently underwent successful cardioversion.  He has been unaware of recurrent atrial fibrillation and has been off anticoagulation with his CHA2DS2-VASc score of 0 which was discontinued 1 month following his cardioversion.  Due to concerns for obstructive sleep apnea playing a role in his atrial fibrillation development as well as a history of snoring a sleep study confirmed moderate overall sleep apnea with an AHI of 24.3/h; however, events were severe with supine sleep with an AHI of 42.0.  He snored for 60.8% of the sleep study.  Unfortunately due to supply chain issues it took approximately 7 months for him to get a new ResMed AirSense 11 CPAP auto unit and his set up date was March 01, 2022.  On his initial download he has met compliance standards.  However, usage was only 5 hours and 50 minutes per night.  His most recent download over the past month from September 12 through May 18, 2022 shows significantly reduced compliance with only 57% of usage days with average use just under 3 hours.  I had a long discussion with him today.  I discussed optimal sleep duration at 7 and 9 hours with his age of 56 years old.  I discussed normal sleep architecture and the adverse effects of disruptive sleep created by sleep apnea.  I specifically discussed its effects on blood pressure control, increased risk for nocturnal arrhythmias and increased risk of atrial fibrillation if untreated.  In addition I discussed its effects on insulin resistance, increased inflammatory markers, as well as significantly increased nocturnal GERD.  I discussed potential implications of nocturnal hypoxemia contributing to nocturnal ischemia both cardiac as well as cerebrovascular if there  is underlying atherosclerosis.  He has noticed nocturia has improved and is 50% less since initiating CPAP therapy and I reviewed the pathophysiology associated with this.  He typically goes to bed around 9 or 930 and wakes up at 530.  I discussed with him that usually sleep apnea is more severe during REM sleep and that the preponderance of REM sleep occurs in the second half of the night.  Previously after getting up to go to the bathroom he often would not put his mask back on.  I strongly recommended that prior to going back into the bed from going to the bathroom that he should place his mask on and then go in bed.  This will allow him to have improved sleep in the second half of the night and probably achieve greater amount of REM sleep.  He is appreciative of the education that he received today and now has a much better understanding as to the  implications of untreated sleep apnea with reference to his cardiovascular health and high likelihood that this may have played a role in his development of atrial fibrillation.  I will see him in 1 year for follow-up evaluation but I am available sooner if problems arise.   Medication Adjustments/Labs and Tests Ordered: Current medicines are reviewed at length with the patient today.  Concerns regarding medicines are outlined above.  Medication changes, Labs and Tests ordered today are listed in the Patient Instructions below. Patient Instructions  Medication Instructions:  Your Physician recommend you continue on your current medication as directed.    *If you need a refill on your cardiac medications before your next appointment, please call your pharmacy*   Lab Work: None ordered today   Testing/Procedures: None ordered today   Follow-Up: At [facility], you and your health needs are our priority.  As part of our continuing mission to provide you with exceptional heart care, we have created designated Provider Care Teams.  These Care  Teams include your primary Cardiologist (physician) and Advanced Practice Providers (APPs -  Physician Assistants and Nurse Practitioners) who all work together to provide you with the care you need, when you need it.  We recommend signing up for the patient portal called "MyChart".  Sign up information is provided on this After Visit Summary.  MyChart is used to connect with patients for Virtual Visits (Telemedicine).  Patients are able to view lab/test results, encounter notes, upcoming appointments, etc.  Non-urgent messages can be sent to your provider as well.   To learn more about what you can do with MyChart, go to [email or web address].    Your next appointment:   1 year(s)  The format for your next appointment:   In Person  Provider:   Dr. Claiborne Billings             Signed, Shelva Majestic, MD, [facility], [place], American Board of Sleep Medicine  05/21/2022 10:56 AM    [place] [address], [place], [place], [place]  [address] Phone: [phone]

## 2022-05-19 NOTE — Patient Instructions (Signed)
Medication Instructions:  Your Physician recommend you continue on your current medication as directed.    *If you need a refill on your cardiac medications before your next appointment, please call your pharmacy*   Lab Work: None ordered today   Testing/Procedures: None ordered today   Follow-Up: At [facility], you and your health needs are our priority.  As part of our continuing mission to provide you with exceptional heart care, we have created designated Provider Care Teams.  These Care Teams include your primary Cardiologist (physician) and Advanced Practice Providers (APPs -  Physician Assistants and Nurse Practitioners) who all work together to provide you with the care you need, when you need it.  We recommend signing up for the patient portal called "MyChart".  Sign up information is provided on this After Visit Summary.  MyChart is used to connect with patients for Virtual Visits (Telemedicine).  Patients are able to view lab/test results, encounter notes, upcoming appointments, etc.  Non-urgent messages can be sent to your provider as well.   To learn more about what you can do with MyChart, go to [email or web address].    Your next appointment:   1 year(s)  The format for your next appointment:   In Person  Provider:   Dr. Claiborne Billings

## 2022-05-21 ENCOUNTER — Encounter: Payer: Self-pay | Admitting: Cardiovascular Disease

## 2022-06-02 ENCOUNTER — Other Ambulatory Visit: Payer: Self-pay | Admitting: Urology

## 2022-06-23 IMAGING — CR DG CHEST 2V
2 series · 2 of 2 positions shown · non-contrast
Comparison: None.

CLINICAL DATA: Atrial fibrillation

EXAM:
CHEST - 2 VIEW

[chest pa]
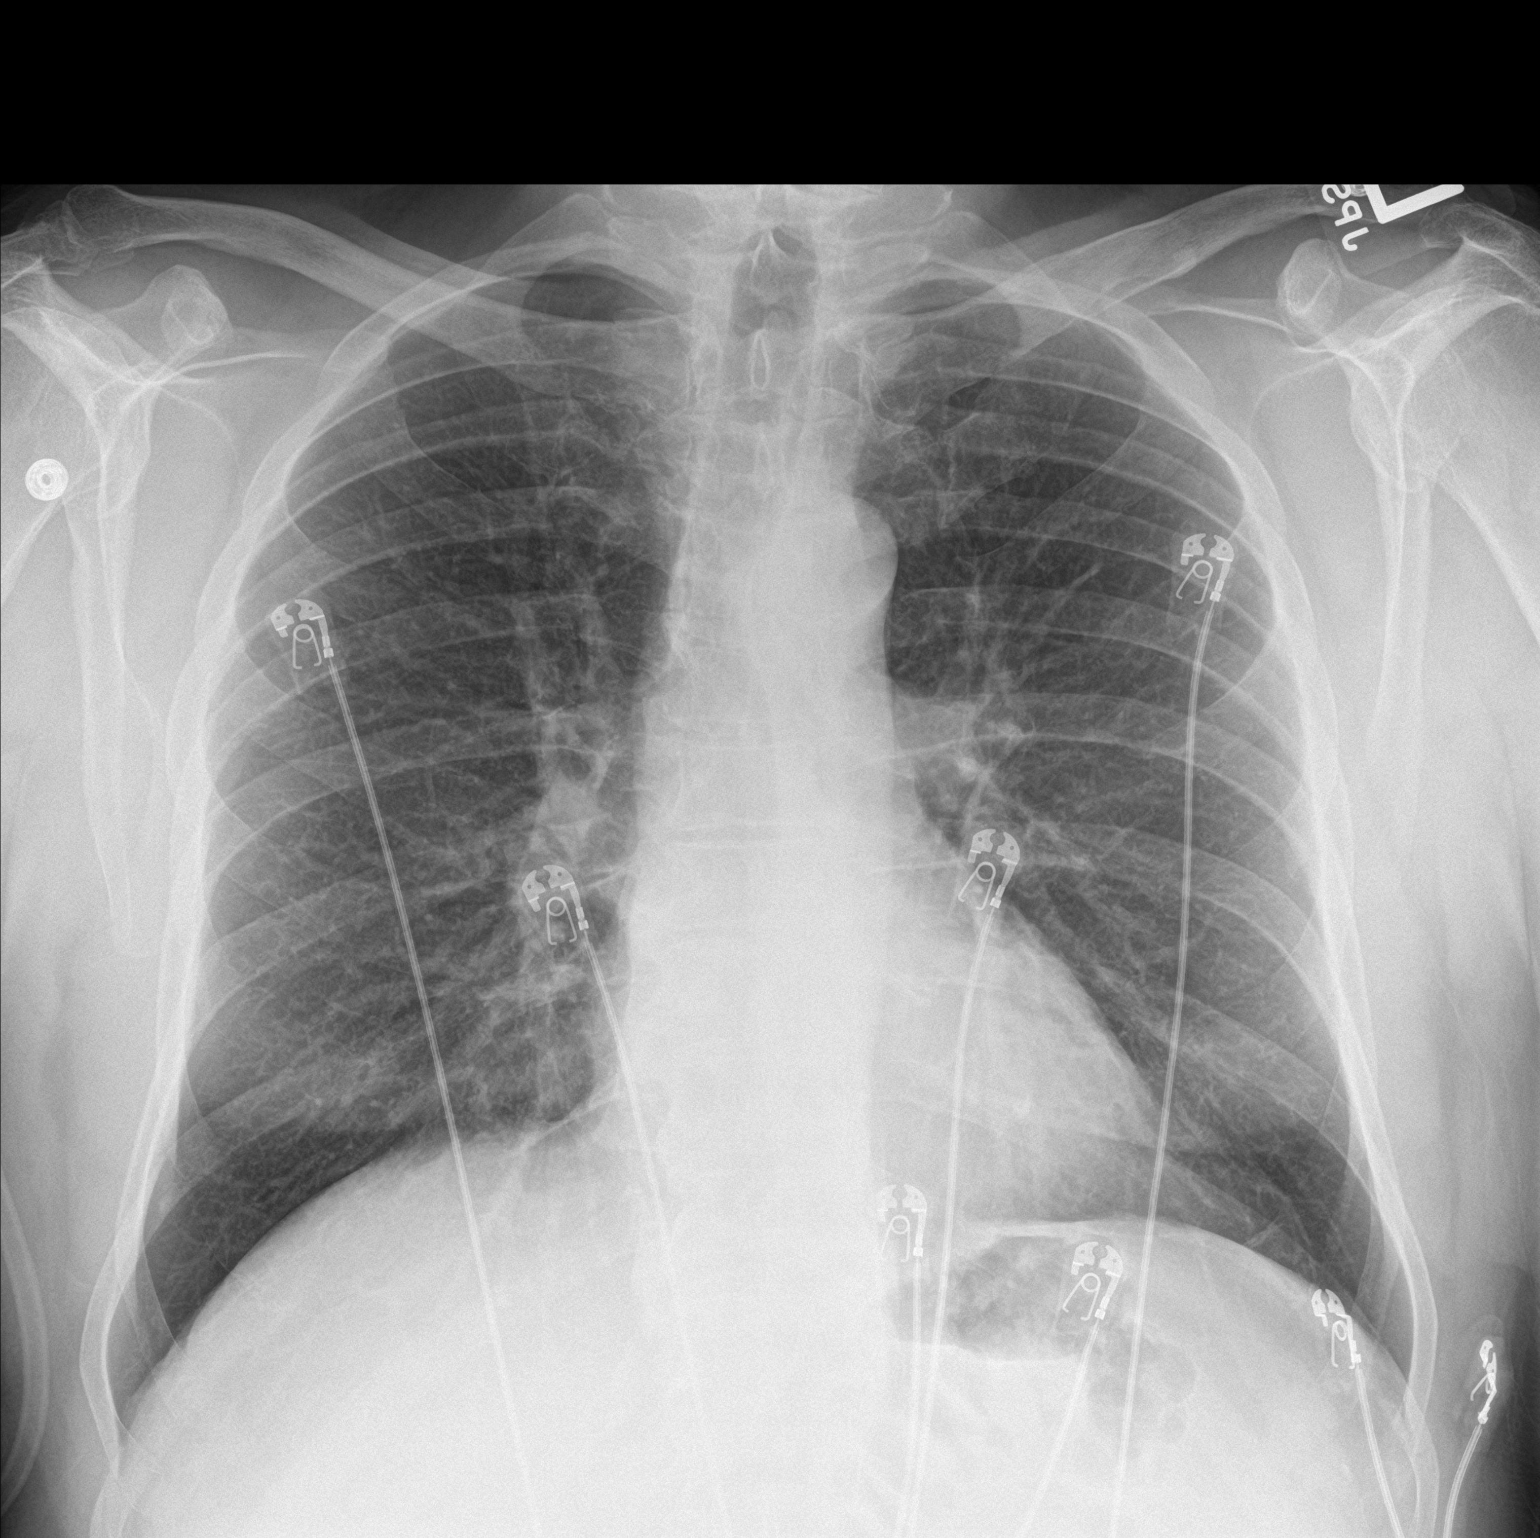

[chest lat]
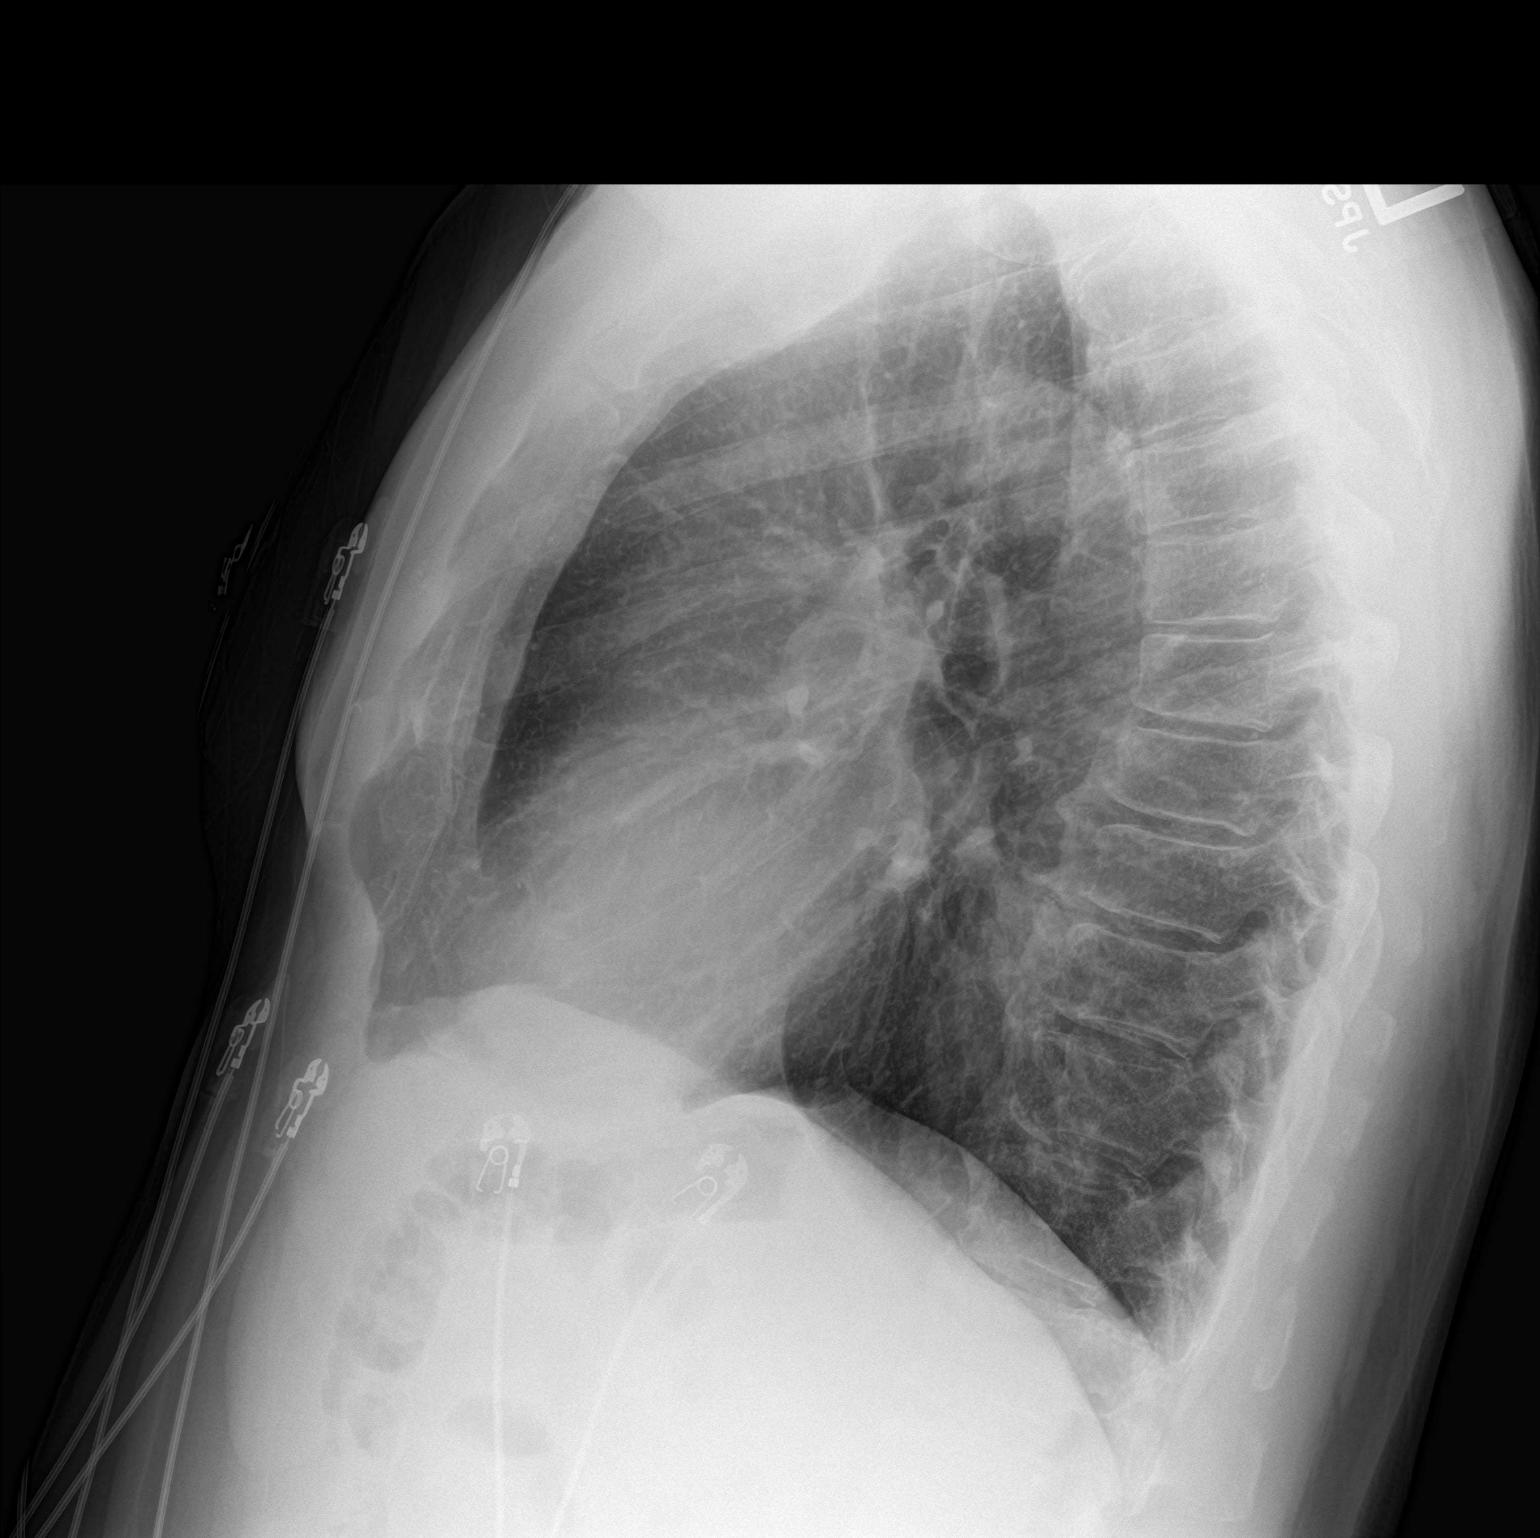

[2 of 2 positions shown; findings below may reference images not displayed]

FINDINGS: Lungs are clear. The heart size and pulmonary vascularity are
normal. No adenopathy. No pneumothorax. No bone lesions.
IMPRESSION: Lungs clear.  Cardiac silhouette normal.

## 2022-07-21 ENCOUNTER — Encounter: Payer: Self-pay | Admitting: *Deleted

## 2022-08-22 ENCOUNTER — Ambulatory Visit (INDEPENDENT_AMBULATORY_CARE_PROVIDER_SITE_OTHER): Payer: 59 | Admitting: Family Medicine

## 2022-08-22 ENCOUNTER — Telehealth: Payer: Self-pay | Admitting: Family Medicine

## 2022-08-22 ENCOUNTER — Encounter: Payer: Self-pay | Admitting: Family Medicine

## 2022-08-22 VITALS — BP 150/92 | HR 66 | Temp 97.6°F | Ht 74.0 in | Wt 230.4 lb

## 2022-08-22 DIAGNOSIS — R0789 Other chest pain: Secondary | ICD-10-CM

## 2022-08-22 DIAGNOSIS — I10 Essential (primary) hypertension: Secondary | ICD-10-CM | POA: Diagnosis not present

## 2022-08-22 MED ORDER — AMLODIPINE BESYLATE 2.5 MG PO TABS: 2.5000 mg | ORAL_TABLET | Freq: Every day | ORAL | 1 refills | Status: DC

## 2022-08-22 NOTE — Progress Notes (Signed)
Subjective:  Patient ID: Shane Miles, male    DOB: August 22, 1965  Age: 57 y.o. MRN: [id number]  CC:  Chief Complaint  Patient presents with   Hypertension    Pt almost went to ED last night due to higher numbers 171/90, 167/96 181/110, 168/110 denied light head dizzy but notes some headache behind his eyes denied visual changes, machine at home is about 59 years old and then went a bought a new one due to high readings     HPI Shane Miles presents for    Hypertension: With history of atrial fibrillation, obstructive sleep apnea -followed by cardiologist Dr. Claiborne Billings. PCP is Dr. Jerline Pain.  Last visit with Dr. Jerline Pain reviewed from March 2023.  Blood pressure 168/96 at that time, but well-controlled at home.  Lifestyle modifications discussed with salt avoidance and regular exercise.  He was continued on alfuzosin 10 mg daily, added 5 mg finasteride for BPH, metoprolol 25 mg twice daily for A-fib (prior cardioversion, no anticoagulation). Prior labs noted, slight hyperglycemia with glucose 106, normal creatinine 0.95.  TSH, CBC, A1c normal, 1 year follow-up planned.  Presents today for elevated blood pressure.  Nurse phone call noted., blood pressure was 180/110 last night, today 165/90. No symptoms at the time.   Occasional BP at [facility]. 140 range/80's, 120/80 at time.  No symptoms, but he checked his BP when wife checking hers this weekend and readings high as above.  Record of home blood pressures: Saturday, January 13, 156/91, 124/95,  Yesterday 166/91, 172/85, 168/108 Today 164/84, 164/87. No palpitations.  No recent change in diet, no added salt. Not taking alfuzosin or finasteride since last summer - did not feel it was helping.   Slight headache, pressure feeling behind the eyes, but no blurry vision no chest pain, focal weakness or other new/acute symptoms. Does feel a little tightness in the chest on the way to our office today - past few hrs.  Left side of chest.  Mild 2/10  tightness. No arm/jaw/neck radiation. 1/10 currently.  No diaphoresis, dyspnea, n/v. No treatments.  No hx of MI/CVA/CKD.   BP Readings from Last 3 Encounters:  08/22/22 (!) 150/92  05/19/22 139/77  11/02/21 (!) 168/96   Lab Results  Component Value Date   CREATININE 0.95 11/02/2021     History Patient Active Problem List   Diagnosis Date Noted   Atrial fibrillation ([place]) 11/02/2021   Benign prostatic hyperplasia 03/03/2021   Osteoarthritis 03/05/2020   Elevated blood pressure reading 02/03/2020   Past Medical History:  Diagnosis Date   Arthritis    Past Surgical History:  Procedure Laterality Date   APPENDECTOMY  2003   CARDIOVERSION N/A 05/25/2021   Procedure: CARDIOVERSION;  Surgeon: Fay Records, MD;  Location: [facility];  Service: Cardiovascular;  Laterality: N/A;   FOREIGN BODY REMOVAL Right    right thing   left knee meniscus  2010   right knee meniscus  2013   No Known Allergies Prior to Admission medications   Medication Sig Start Date End Date Taking? Authorizing Provider  metoprolol tartrate (LOPRESSOR) 25 MG tablet Take 1 tablet (25 mg total) by mouth 2 (two) times daily. 10/25/21  Yes Sherran Needs, NP  Acetaminophen (TYLENOL PO) Take 500 mg by mouth as needed. Patient not taking: Reported on 05/19/2022    [provider]  alfuzosin (UROXATRAL) 10 MG 24 hr tablet TAKE 1 TABLET BY MOUTH EVERYDAY AT BEDTIME Patient not taking: Reported on 08/22/2022 06/02/22   Cleon Gustin,  MD  finasteride (PROSCAR) 5 MG tablet Take 1 tablet (5 mg total) by mouth daily. Patient not taking: Reported on 08/22/2022 11/02/21   [name], MD   Social History   Socioeconomic History   Marital status: Married    Spouse name: Not on file   Number of children: Not on file   Years of education: Not on file   Highest education level: Not on file  Occupational History   Not on file  Tobacco Use   Smoking status: Never   Smokeless tobacco: Never   Vaping Use   Vaping Use: Never used  Substance and Sexual Activity   Alcohol use: Never   Drug use: Never   Sexual activity: Yes  Other Topics Concern   Not on file  Social History Narrative   Married   Works in [place] Strain: Not on file  Food Insecurity: Not on file  Transportation Needs: Not on file  Physical Activity: Not on file  Stress: Not on file  Social Connections: Not on file  Intimate Partner Violence: Not on file    Review of Systems Per HPI  Objective:   Vitals:   08/22/22 1151 08/22/22 1205  BP: (!) 168/94 (!) 150/92  Pulse: 66   Temp: 97.6 F (36.4 C)   TempSrc: Temporal   SpO2: 98%   Weight: 230 lb 6.4 oz (104.5 kg)   Height: '6\' 2"'$  (1.88 m)      Physical Exam Vitals reviewed.  Constitutional:      Appearance: He is well-developed.  HENT:     Head: Normocephalic and atraumatic.  Eyes:     Extraocular Movements: Extraocular movements intact.     Right eye: No nystagmus.     Left eye: No nystagmus.     Pupils: Pupils are equal, round, and reactive to light.  Neck:     Vascular: No carotid bruit or JVD.  Cardiovascular:     Rate and Rhythm: Normal rate and regular rhythm.     Heart sounds: Normal heart sounds. No murmur heard.    Comments: Chest wall nontender.  Pulmonary:     Effort: Pulmonary effort is normal.     Breath sounds: Normal breath sounds. No rales.  Musculoskeletal:     Right lower leg: No edema.     Left lower leg: No edema.  Skin:    General: Skin is warm and dry.  Neurological:     General: No focal deficit present.     Mental Status: He is alert and oriented to person, place, and time.     GCS: GCS eye subscore is 4. GCS verbal subscore is 5. GCS motor subscore is 6.     Cranial Nerves: No cranial nerve deficit, dysarthria or facial asymmetry.     Motor: No weakness, tremor or pronator drift.     Coordination: Coordination is intact. Romberg sign negative.  Coordination normal. Finger-Nose-Finger Test and Heel to [facility] Test normal. Rapid alternating movements normal.     Gait: Gait is intact.  Psychiatric:        Mood and Affect: Mood normal.     EKG: Sinus rhythm, rate 57 - sinus bradycardia.  Compared to EKG performed on 05/20/22 - No apparent acute findings or significant changes.  T wave inversion also noted in lead IRI at that time.  Artifact in V3.  1:06 PM At this time he denies any further chest symptoms or tightness.  Does note that he may have been having some symptoms earlier just with the stress of coming here for the elevated blood pressure.  Again asymptomatic at this time.   Assessment & Plan:  Shane Miles is a 57 y.o. male . Essential hypertension - Plan: EKG 12-Lead, amLODipine (NORVASC) 2.5 MG tablet  Chest tightness - Plan: EKG 12-Lead Uncontrolled hypertension that was incidentally found with checking blood pressure at home.  He has been off his doxazosin and finasteride.  Asymptomatic with significant elevated readings as listed above.  Minimal frontal headache/pressure but nonfocal neuro exam.  Likely related to his elevated blood pressure.  Thanks chest tightness/discomfort on the way here today, but he had been lifting some heavy objects previously, and symptoms resolved while in office.  No apparent EKG changes.  -Given bradycardia I will hold on changes to metoprolol at this time.  Add amlodipine 2.5 mg daily.  Likely will need higher dose but will start with this and close follow-up with PCP for medication adjustment.  -911/ER precautions given if any return of chest symptoms, or new symptoms with elevated blood pressure  -All questions answered with understanding of plan expressed.    Meds ordered this encounter  Medications   amLODipine (NORVASC) 2.5 MG tablet    Sig: Take 1 tablet (2.5 mg total) by mouth daily.    Dispense:  30 tablet    Refill:  1   Patient Instructions  Thank you for coming in today.  We  can add a low-dose of amlodipine initially that can help control blood pressure.  You may need a higher dose, but we will start with 2.5 mg initially with close follow-up with your primary care provider in the next 2 weeks.  I am hesitant to change the metoprolol as that can slow your heart rate down further.  See information below on managing blood pressure.  I do not see any acute changes in your EKG today, but as we discussed if any return of chest tightness or any chest symptoms call 911 or be seen in the emergency room for further testing.  Take care!  Managing Your Hypertension Hypertension, also called high blood pressure, is when the force of the blood pressing against the walls of the arteries is too strong. Arteries are blood vessels that carry blood from your heart throughout your body. Hypertension forces the heart to work harder to pump blood and may cause the arteries to become narrow or stiff. Understanding blood pressure readings A blood pressure reading includes a higher number over a lower number: The first, or top, number is called the systolic pressure. It is a measure of the pressure in your arteries as your heart beats. The second, or bottom number, is called the diastolic pressure. It is a measure of the pressure in your arteries as the heart relaxes. For most people, a normal blood pressure is below 120/80. Your personal target blood pressure may vary depending on your medical conditions, your age, and other factors. Blood pressure is classified into four stages. Based on your blood pressure reading, your health care provider may use the following stages to determine what type of treatment you need, if any. Systolic pressure and diastolic pressure are measured in a unit called millimeters of mercury (mmHg). Normal Systolic pressure: below 563. Diastolic pressure: below 80. Elevated Systolic pressure: 893-734. Diastolic pressure: below 80. Hypertension stage 1 Systolic  pressure: 287-681. Diastolic pressure: 15-72. Hypertension stage 2 Systolic pressure: 620 or above. Diastolic pressure: 90 or  above. How can this condition affect me? Managing your hypertension is very important. Over time, hypertension can damage the arteries and decrease blood flow to parts of the body, including the brain, heart, and kidneys. Having untreated or uncontrolled hypertension can lead to: A heart attack. A stroke. A weakened blood vessel (aneurysm). Heart failure. Kidney damage. Eye damage. Memory and concentration problems. Vascular dementia. What actions can I take to manage this condition? Hypertension can be managed by making lifestyle changes and possibly by taking medicines. Your health care provider will help you make a plan to bring your blood pressure within a normal range. You may be referred for counseling on a healthy diet and physical activity. Nutrition  Eat a diet that is high in fiber and potassium, and low in salt (sodium), added sugar, and fat. An example eating plan is called the DASH diet. DASH stands for Dietary Approaches to Stop Hypertension. To eat this way: Eat plenty of fresh fruits and vegetables. Try to fill one-half of your plate at each meal with fruits and vegetables. Eat whole grains, such as whole-wheat pasta, brown rice, or whole-grain bread. Fill about one-fourth of your plate with whole grains. Eat low-fat dairy products. Avoid fatty cuts of meat, processed or cured meats, and poultry with skin. Fill about one-fourth of your plate with lean proteins such as fish, chicken without skin, beans, eggs, and tofu. Avoid pre-made and processed foods. These tend to be higher in sodium, added sugar, and fat. Reduce your daily sodium intake. Many people with hypertension should eat less than 1,500 mg of sodium a day. Lifestyle  Work with your health care provider to maintain a healthy body weight or to lose weight. Ask what an ideal weight is for  you. Get at least 30 minutes of exercise that causes your heart to beat faster (aerobic exercise) most days of the week. Activities may include walking, swimming, or biking. Include exercise to strengthen your muscles (resistance exercise), such as weight lifting, as part of your weekly exercise routine. Try to do these types of exercises for 30 minutes at least 3 days a week. Do not use any products that contain nicotine or tobacco. These products include cigarettes, chewing tobacco, and vaping devices, such as e-cigarettes. If you need help quitting, ask your health care provider. Control any long-term (chronic) conditions you have, such as high cholesterol or diabetes. Identify your sources of stress and find ways to manage stress. This may include meditation, deep breathing, or making time for fun activities. Alcohol use Do not drink alcohol if: Your health care provider tells you not to drink. You are pregnant, may be pregnant, or are planning to become pregnant. If you drink alcohol: Limit how much you have to: 0-1 drink a day for women. 0-2 drinks a day for men. Know how much alcohol is in your drink. In the [place] one drink equals one 12 oz bottle of beer (355 mL), one 5 oz glass of wine (148 mL), or one 1 oz glass of hard liquor (44 mL). Medicines Your health care provider may prescribe medicine if lifestyle changes are not enough to get your blood pressure under control and if: Your systolic blood pressure is 130 or higher. Your diastolic blood pressure is 80 or higher. Take medicines only as told by your health care provider. Follow the directions carefully. Blood pressure medicines must be taken as told by your health care provider. The medicine does not work as well when you skip doses. Skipping  doses also puts you at risk for problems. Monitoring Before you monitor your blood pressure: Do not smoke, drink caffeinated beverages, or exercise within 30 minutes before taking a  measurement. Use the bathroom and empty your bladder (urinate). Sit quietly for at least 5 minutes before taking measurements. Monitor your blood pressure at home as told by your health care provider. To do this: Sit with your back straight and supported. Place your feet flat on the floor. Do not cross your legs. Support your arm on a flat surface, such as a table. Make sure your upper arm is at heart level. Each time you measure, take two or three readings one minute apart and record the results. You may also need to have your blood pressure checked regularly by your health care provider. General information Talk with your health care provider about your diet, exercise habits, and other lifestyle factors that may be contributing to hypertension. Review all the medicines you take with your health care provider because there may be side effects or interactions. Keep all follow-up visits. Your health care provider can help you create and adjust your plan for managing your high blood pressure. Where to find more information National Heart, Lung, and Blood Institute: https://wilson-eaton.com/ American Heart Association: www.heart.org Contact a health care provider if: You think you are having a reaction to medicines you have taken. You have repeated (recurrent) headaches. You feel dizzy. You have swelling in your ankles. You have trouble with your vision. Get help right away if: You develop a severe headache or confusion. You have unusual weakness or numbness, or you feel faint. You have severe pain in your chest or abdomen. You vomit repeatedly. You have trouble breathing. These symptoms may be an emergency. Get help right away. Call 911. Do not wait to see if the symptoms will go away. Do not drive yourself to the hospital. Summary Hypertension is when the force of blood pumping through your arteries is too strong. If this condition is not controlled, it may put you at risk for serious  complications. Your personal target blood pressure may vary depending on your medical conditions, your age, and other factors. For most people, a normal blood pressure is less than 120/80. Hypertension is managed by lifestyle changes, medicines, or both. Lifestyle changes to help manage hypertension include losing weight, eating a healthy, low-sodium diet, exercising more, stopping smoking, and limiting alcohol. This information is not intended to replace advice given to you by your health care provider. Make sure you discuss any questions you have with your health care provider. Document Revised: 04/08/2021 Document Reviewed: 04/08/2021 Elsevier Patient Education  2023 [place],   Merri Ray, MD [place] 08/22/22 1:06 PM

## 2022-08-22 NOTE — Patient Instructions (Signed)
Thank you for coming in today.  We can add a low-dose of amlodipine initially that can help control blood pressure.  You may need a higher dose, but we will start with 2.5 mg initially with close follow-up with your primary care provider in the next 2 weeks.  I am hesitant to change the metoprolol as that can slow your heart rate down further.  See information below on managing blood pressure.  I do not see any acute changes in your EKG today, but as we discussed if any return of chest tightness or any chest symptoms call 911 or be seen in the emergency room for further testing.  Take care!  Managing Your Hypertension Hypertension, also called high blood pressure, is when the force of the blood pressing against the walls of the arteries is too strong. Arteries are blood vessels that carry blood from your heart throughout your body. Hypertension forces the heart to work harder to pump blood and may cause the arteries to become narrow or stiff. Understanding blood pressure readings A blood pressure reading includes a higher number over a lower number: The first, or top, number is called the systolic pressure. It is a measure of the pressure in your arteries as your heart beats. The second, or bottom number, is called the diastolic pressure. It is a measure of the pressure in your arteries as the heart relaxes. For most people, a normal blood pressure is below 120/80. Your personal target blood pressure may vary depending on your medical conditions, your age, and other factors. Blood pressure is classified into four stages. Based on your blood pressure reading, your health care provider may use the following stages to determine what type of treatment you need, if any. Systolic pressure and diastolic pressure are measured in a unit called millimeters of mercury (mmHg). Normal Systolic pressure: below 283. Diastolic pressure: below 80. Elevated Systolic pressure: 662-947. Diastolic pressure: below  80. Hypertension stage 1 Systolic pressure: 654-650. Diastolic pressure: 35-46. Hypertension stage 2 Systolic pressure: 568 or above. Diastolic pressure: 90 or above. How can this condition affect me? Managing your hypertension is very important. Over time, hypertension can damage the arteries and decrease blood flow to parts of the body, including the brain, heart, and kidneys. Having untreated or uncontrolled hypertension can lead to: A heart attack. A stroke. A weakened blood vessel (aneurysm). Heart failure. Kidney damage. Eye damage. Memory and concentration problems. Vascular dementia. What actions can I take to manage this condition? Hypertension can be managed by making lifestyle changes and possibly by taking medicines. Your health care provider will help you make a plan to bring your blood pressure within a normal range. You may be referred for counseling on a healthy diet and physical activity. Nutrition  Eat a diet that is high in fiber and potassium, and low in salt (sodium), added sugar, and fat. An example eating plan is called the DASH diet. DASH stands for Dietary Approaches to Stop Hypertension. To eat this way: Eat plenty of fresh fruits and vegetables. Try to fill one-half of your plate at each meal with fruits and vegetables. Eat whole grains, such as whole-wheat pasta, brown rice, or whole-grain bread. Fill about one-fourth of your plate with whole grains. Eat low-fat dairy products. Avoid fatty cuts of meat, processed or cured meats, and poultry with skin. Fill about one-fourth of your plate with lean proteins such as fish, chicken without skin, beans, eggs, and tofu. Avoid pre-made and processed foods. These tend to be  higher in sodium, added sugar, and fat. Reduce your daily sodium intake. Many people with hypertension should eat less than 1,500 mg of sodium a day. Lifestyle  Work with your health care provider to maintain a healthy body weight or to lose  weight. Ask what an ideal weight is for you. Get at least 30 minutes of exercise that causes your heart to beat faster (aerobic exercise) most days of the week. Activities may include walking, swimming, or biking. Include exercise to strengthen your muscles (resistance exercise), such as weight lifting, as part of your weekly exercise routine. Try to do these types of exercises for 30 minutes at least 3 days a week. Do not use any products that contain nicotine or tobacco. These products include cigarettes, chewing tobacco, and vaping devices, such as e-cigarettes. If you need help quitting, ask your health care provider. Control any long-term (chronic) conditions you have, such as high cholesterol or diabetes. Identify your sources of stress and find ways to manage stress. This may include meditation, deep breathing, or making time for fun activities. Alcohol use Do not drink alcohol if: Your health care provider tells you not to drink. You are pregnant, may be pregnant, or are planning to become pregnant. If you drink alcohol: Limit how much you have to: 0-1 drink a day for women. 0-2 drinks a day for men. Know how much alcohol is in your drink. In the [place] one drink equals one 12 oz bottle of beer (355 mL), one 5 oz glass of wine (148 mL), or one 1 oz glass of hard liquor (44 mL). Medicines Your health care provider may prescribe medicine if lifestyle changes are not enough to get your blood pressure under control and if: Your systolic blood pressure is 130 or higher. Your diastolic blood pressure is 80 or higher. Take medicines only as told by your health care provider. Follow the directions carefully. Blood pressure medicines must be taken as told by your health care provider. The medicine does not work as well when you skip doses. Skipping doses also puts you at risk for problems. Monitoring Before you monitor your blood pressure: Do not smoke, drink caffeinated beverages, or exercise  within 30 minutes before taking a measurement. Use the bathroom and empty your bladder (urinate). Sit quietly for at least 5 minutes before taking measurements. Monitor your blood pressure at home as told by your health care provider. To do this: Sit with your back straight and supported. Place your feet flat on the floor. Do not cross your legs. Support your arm on a flat surface, such as a table. Make sure your upper arm is at heart level. Each time you measure, take two or three readings one minute apart and record the results. You may also need to have your blood pressure checked regularly by your health care provider. General information Talk with your health care provider about your diet, exercise habits, and other lifestyle factors that may be contributing to hypertension. Review all the medicines you take with your health care provider because there may be side effects or interactions. Keep all follow-up visits. Your health care provider can help you create and adjust your plan for managing your high blood pressure. Where to find more information National Heart, Lung, and Blood Institute: https://wilson-eaton.com/ American Heart Association: www.heart.org Contact a health care provider if: You think you are having a reaction to medicines you have taken. You have repeated (recurrent) headaches. You feel dizzy. You have swelling in your ankles.  You have trouble with your vision. Get help right away if: You develop a severe headache or confusion. You have unusual weakness or numbness, or you feel faint. You have severe pain in your chest or abdomen. You vomit repeatedly. You have trouble breathing. These symptoms may be an emergency. Get help right away. Call 911. Do not wait to see if the symptoms will go away. Do not drive yourself to the hospital. Summary Hypertension is when the force of blood pumping through your arteries is too strong. If this condition is not controlled, it may  put you at risk for serious complications. Your personal target blood pressure may vary depending on your medical conditions, your age, and other factors. For most people, a normal blood pressure is less than 120/80. Hypertension is managed by lifestyle changes, medicines, or both. Lifestyle changes to help manage hypertension include losing weight, eating a healthy, low-sodium diet, exercising more, stopping smoking, and limiting alcohol. This information is not intended to replace advice given to you by your health care provider. Make sure you discuss any questions you have with your health care provider. Document Revised: 04/08/2021 Document Reviewed: 04/08/2021 Elsevier Patient Education  [place].

## 2022-08-22 NOTE — Telephone Encounter (Signed)
Patient states: -He has been experiencing elevated BP levels  - BP reading last night was 181/110 and this morning it was 164/87 - He is having no other symptoms   Patient has been transferred to triage.

## 2022-08-22 NOTE — Telephone Encounter (Signed)
Pt currently being scheduled by [facility]   Patient Name: Shane Miles Gender: Male DOB: 02-11-1966 Age: 57 Y 3 M 9 D Return Phone Number: [phone] (Primary) Address: City/ State/ Zip: [place] [address] Client [facility] Provider Dimas Chyle- MD Contact Type Call Who Is Calling Patient / Member / Family / Caregiver Call Type Triage / Clinical Relationship To Patient Self Return Phone Number [phone] (Primary) Chief Complaint Blood Pressure High Reason for Call Symptomatic / Request for [place] states he has elevated BP. BP 180/110 Translation No Nurse Assessment Nurse: Kathi Ludwig, RN, Leana Roe Date/Time (Eastern Time): 08/22/2022 9:01:39 AM Confirm and document reason for call. If symptomatic, describe symptoms. ---Caller states BP 180/110 last night. today 165/90. takes Metoprolol '25mg'$  BID for afib. no HA, blurred vision, dizziness, cp, or dyspnea Does the patient have any new or worsening symptoms? ---Yes Will a triage be completed? ---Yes Related visit to physician within the last 2 weeks? ---No Does the PT have any chronic conditions? (i.e. diabetes, asthma, this includes High risk factors for pregnancy, etc.) ---Yes List chronic conditions. ---afib with cardioversion, HTN Is this a behavioral health or substance abuse call? ---No Guidelines Guideline Title Affirmed Question Affirmed Notes Nurse Date/Time (Eastern Time) Blood Pressure - High Systolic BP >= 347 OR Diastolic >= 425 Prichard, RN, Tracie 08/22/2022 9:03:50 AM Disp. Time Eilene Ghazi Time) Disposition Final User 08/22/2022 9:05:19 AM See PCP within 24 Hours Yes Kathi Ludwig, RN, Leana Roe Final Disposition 08/22/2022 9:05:19 AM See PCP within 24 Hours Yes Kathi Ludwig, RN, Leana Roe PLEASE NOTE: All timestamps contained within this report are represented as [place] Standard  Time. CONFIDENTIALTY NOTICE: This fax transmission is intended only for the addressee. It contains information that is legally privileged, confidential or otherwise protected from use or disclosure. If you are not the intended recipient, you are strictly prohibited from reviewing, disclosing, copying using or disseminating any of this information or taking any action in reliance on or regarding this information. If you have received this fax in error, please notify us immediately by telephone so that we can arrange for its return to [place]. Phone: [phone], Toll-Free: [phone], Fax: [phone] Page: 2 of 2 Call Id: [id number] Caller Disagree/Comply Comply Caller Understands Yes PreDisposition Call Doctor Care Advice Given Per Guideline SEE PCP WITHIN 24 HOURS: * IF OFFICE WILL BE OPEN: You need to be examined within the next 24 hours. Call your doctor (or NP/PA) when the office opens and make an appointment. CARE ADVICE given per High Blood Pressure (Adult) guideline. CALL BACK IF: * Weakness or numbness of the face, arm or leg on one side of the body occurs * Difficulty walking, difficulty talking, or severe headache occurs * You become worse * Chest pain or difficulty breathing occurs Referrals REFERRED TO PCP OFFICE

## 2022-09-05 ENCOUNTER — Encounter: Payer: Self-pay | Admitting: Family Medicine

## 2022-09-05 ENCOUNTER — Ambulatory Visit: Payer: 59 | Admitting: Family Medicine

## 2022-09-05 VITALS — BP 137/77 | HR 71 | Temp 97.3°F | Ht 74.0 in | Wt 227.4 lb

## 2022-09-05 DIAGNOSIS — Z23 Encounter for immunization: Secondary | ICD-10-CM

## 2022-09-05 DIAGNOSIS — I10 Essential (primary) hypertension: Secondary | ICD-10-CM

## 2022-09-05 DIAGNOSIS — I4891 Unspecified atrial fibrillation: Secondary | ICD-10-CM | POA: Diagnosis not present

## 2022-09-05 MED ORDER — AMLODIPINE BESYLATE 2.5 MG PO TABS: 2.5000 mg | ORAL_TABLET | Freq: Every day | ORAL | 3 refills | Status: DC

## 2022-09-05 NOTE — Progress Notes (Signed)
   Shane Miles is a 57 y.o. male who presents today for an office visit.  Assessment/Plan:  Chronic Problems Addressed Today: Essential hypertension Blood pressure much better controlled today.  Will continue current regimen amlodipine 2.5 mg daily and Metroprolol tartrate 25 mg twice daily.  He will follow-up in a couple of months for his annual physical.  Atrial fibrillation (HCC) Regular rate and rhythm today.  On metoprolol tartrate 25 mg twice daily.  He will follow-up with cardiology soon.  Flu shot given today.     Subjective:  HPI:  Patient here today for follow-up.  We last saw him about 9 months ago for his annual physical.  He started having increased blood pressure readings at home a couple of weeks ago and was seen at a different office.  Readings at home were as high as the 180s over 110s.  He has been getting several readings in the 160s and 170s.  He was started on amlodipine 2.5 mg daily. He has been doing well with this. BPs have come down to the 120s/70s at home. No significant side effects.        Objective:  Physical Exam: BP 137/77   Pulse 71   Temp (!) 97.3 F (36.3 C) (Temporal)   Ht '6\' 2"'$  (1.88 m)   Wt 227 lb 6.4 oz (103.1 kg)   SpO2 97%   BMI 29.20 kg/m   Gen: No acute distress, resting comfortably CV: Regular rate and rhythm with no murmurs appreciated Pulm: Normal work of breathing, clear to auscultation bilaterally with no crackles, wheezes, or rhonchi Neuro: Grossly normal, moves all extremities Psych: Normal affect and thought content      [name] M. Jerline Pain, MD 09/05/2022 9:18 AM

## 2022-09-05 NOTE — Patient Instructions (Signed)
It was very nice to see you today!  We will continue current medications.  Will see back in a couple months for your physical.  Come back sooner if needed.  Take care, Dr Jerline Pain  PLEASE NOTE:  If you had any lab tests, please let us know if you have not heard back within a few days. You may see your results on mychart before we have a chance to review them but we will give you a call once they are reviewed by [place].   If we ordered any referrals today, please let us know if you have not heard from their office within the next week.   If you had any urgent prescriptions sent in today, please check with the pharmacy within an hour of our visit to make sure the prescription was transmitted appropriately.   Please try these tips to maintain a healthy lifestyle:  Eat at least 3 REAL meals and 1-2 snacks per day.  Aim for no more than 5 hours between eating.  If you eat breakfast, please do so within one hour of getting up.   Each meal should contain half fruits/vegetables, one quarter protein, and one quarter carbs (no bigger than a computer mouse)  Cut down on sweet beverages. This includes juice, soda, and sweet tea.   Drink at least 1 glass of water with each meal and aim for at least 8 glasses per day  Exercise at least 150 minutes every week.

## 2022-09-05 NOTE — Assessment & Plan Note (Signed)
Regular rate and rhythm today.  On metoprolol tartrate 25 mg twice daily.  He will follow-up with cardiology soon.

## 2022-09-05 NOTE — Assessment & Plan Note (Signed)
Blood pressure much better controlled today.  Will continue current regimen amlodipine 2.5 mg daily and Metroprolol tartrate 25 mg twice daily.  He will follow-up in a couple of months for his annual physical.

## 2022-09-15 ENCOUNTER — Encounter (HOSPITAL_COMMUNITY): Payer: Self-pay | Admitting: *Deleted

## 2022-09-17 ENCOUNTER — Other Ambulatory Visit (HOSPITAL_COMMUNITY): Payer: Self-pay | Admitting: Nurse Practitioner

## 2022-10-05 ENCOUNTER — Ambulatory Visit: Payer: 59 | Admitting: Cardiovascular Disease

## 2022-11-04 ENCOUNTER — Encounter: Payer: 59 | Admitting: Family Medicine

## 2022-11-11 ENCOUNTER — Encounter: Payer: Self-pay | Admitting: Family Medicine

## 2022-11-11 ENCOUNTER — Ambulatory Visit (INDEPENDENT_AMBULATORY_CARE_PROVIDER_SITE_OTHER): Payer: 59 | Admitting: Family Medicine

## 2022-11-11 VITALS — BP 133/73 | HR 57 | Temp 97.8°F | Ht 74.0 in | Wt 234.6 lb

## 2022-11-11 DIAGNOSIS — Z0001 Encounter for general adult medical examination with abnormal findings: Secondary | ICD-10-CM | POA: Diagnosis not present

## 2022-11-11 DIAGNOSIS — I4891 Unspecified atrial fibrillation: Secondary | ICD-10-CM | POA: Diagnosis not present

## 2022-11-11 DIAGNOSIS — M1990 Unspecified osteoarthritis, unspecified site: Secondary | ICD-10-CM

## 2022-11-11 DIAGNOSIS — Z13220 Encounter for screening for lipoid disorders: Secondary | ICD-10-CM

## 2022-11-11 DIAGNOSIS — N400 Enlarged prostate without lower urinary tract symptoms: Secondary | ICD-10-CM

## 2022-11-11 DIAGNOSIS — R739 Hyperglycemia, unspecified: Secondary | ICD-10-CM

## 2022-11-11 DIAGNOSIS — I10 Essential (primary) hypertension: Secondary | ICD-10-CM | POA: Diagnosis not present

## 2022-11-11 LAB — COMPREHENSIVE METABOLIC PANEL
ALT: 17 U/L (ref 0–53)
AST: 16 U/L (ref 0–37)
Albumin: 4.4 g/dL (ref 3.5–5.2)
Alkaline Phosphatase: 46 U/L (ref 39–117)
BUN: 18 mg/dL (ref 6–23)
CO2: 28 mEq/L (ref 19–32)
Calcium: 9.6 mg/dL (ref 8.4–10.5)
Chloride: 103 mEq/L (ref 96–112)
Creatinine, Ser: 0.79 mg/dL (ref 0.40–1.50)
GFR: 97.93 mL/min (ref >60.00)
Glucose, Bld: 81 mg/dL (ref 70–99)
Potassium: 4 mEq/L (ref 3.5–5.1)
Sodium: 140 mEq/L (ref 135–145)
Total Bilirubin: 0.7 mg/dL (ref 0.2–1.2)
Total Protein: 6.8 g/dL (ref 6.0–8.3)

## 2022-11-11 LAB — CBC
HCT: 42 % (ref 39.0–52.0)
Hemoglobin: 14.7 g/dL (ref 13.0–17.0)
MCHC: 35 g/dL (ref 30.0–36.0)
MCV: 89.5 fl (ref 78.0–100.0)
Platelets: 203 10*3/uL (ref 150.0–400.0)
RBC: 4.69 Mil/uL (ref 4.22–5.81)
RDW: 13.2 % (ref 11.5–15.5)
WBC: 8.5 10*3/uL (ref 4.0–10.5)

## 2022-11-11 LAB — LIPID PANEL
Cholesterol: 153 mg/dL (ref 0–200)
HDL: 44.5 mg/dL (ref >39.00)
LDL Cholesterol: 89 mg/dL (ref 0–99)
NonHDL: 108.77
Total CHOL/HDL Ratio: 3
Triglycerides: 98 mg/dL (ref 0.0–149.0)
VLDL: 19.6 mg/dL (ref 0.0–40.0)

## 2022-11-11 LAB — HEMOGLOBIN A1C: Hgb A1c MFr Bld: 5.3 % (ref 4.6–6.5)

## 2022-11-11 LAB — TSH: TSH: 1.29 u[IU]/mL (ref 0.35–5.50)

## 2022-11-11 LAB — PSA: PSA: 0.91 ng/mL (ref 0.10–4.00)

## 2022-11-11 NOTE — Progress Notes (Signed)
Chief Complaint:  Shane Miles is a 57 y.o. male who presents today for his annual comprehensive physical exam.    Assessment/Plan:  Chronic Problems Addressed Today: Essential hypertension Blood pressure well-controlled today on current regimen amlodipine 2.5 mg daily and Metroprolol tartrate 25 mg twice daily.  Benign prostatic hyperplasia Has follow-up with urology for this in the past.  Not currently needed medications.  Check PSA today.  Atrial fibrillation (HCC) Regular rate and rhythm today.  On metoprolol tartrate 25 mg twice daily  Osteoarthritis Is having a little bit worse pain low back and bilateral hip pain.  He can use over-the-counter meds as needed.  Advised him to follow-up with orthopedics if this continues to be an issue.  Preventative Healthcare: Check labs.  Up-to-date on vaccines.  Due for next colonoscopy next year.  Patient Counseling(The following topics were reviewed and/or handout was given):  -Nutrition: Stressed importance of moderation in sodium/caffeine intake, saturated fat and cholesterol, caloric balance, sufficient intake of fresh fruits, vegetables, and fiber.  -Stressed the importance of regular exercise.   -Substance Abuse: Discussed cessation/primary prevention of tobacco, alcohol, or other drug use; driving or other dangerous activities under the influence; availability of treatment for abuse.   -Injury prevention: Discussed safety belts, safety helmets, smoke detector, smoking near bedding or upholstery.   -Sexuality: Discussed sexually transmitted diseases, partner selection, use of condoms, avoidance of unintended pregnancy and contraceptive alternatives.   -Dental health: Discussed importance of regular tooth brushing, flossing, and dental visits.  -Health maintenance and immunizations reviewed. Please refer to Health maintenance section.  Return to care in 1 year for next preventative visit.     Subjective:  HPI:  He has no acute  complaints today.   Lifestyle Diet: Balanced. Plenty of fruits and vegetables. Cutting down on salt.  Exercise: Walking a few miles at a time a few times per week.      11/11/2022    1:21 PM  Depression screen PHQ 2/9  Decreased Interest 0  Down, Depressed, Hopeless 0  PHQ - 2 Score 0    Health Maintenance Due  Topic Date Due   DTaP/Tdap/Td (1 - Tdap) Never done     ROS: Per HPI, otherwise a complete review of systems was negative.   PMH:  The following were reviewed and entered/updated in epic: Past Medical History:  Diagnosis Date   Arthritis    Patient Active Problem List   Diagnosis Date Noted   Atrial fibrillation 11/02/2021   Benign prostatic hyperplasia 03/03/2021   Osteoarthritis 03/05/2020   Essential hypertension 02/03/2020   Past Surgical History:  Procedure Laterality Date   APPENDECTOMY  2003   CARDIOVERSION N/A 05/25/2021   Procedure: CARDIOVERSION;  Surgeon: Pricilla Riffleoss, Paula V, MD;  Location: [facility];  Service: Cardiovascular;  Laterality: N/A;   FOREIGN BODY REMOVAL Right    right thing   left knee meniscus  2010   right knee meniscus  2013    Family History  Problem Relation Age of Onset   Arthritis Mother    Hypertension Mother    Hypertension Father    Colon cancer Neg Hx    Colon polyps Neg Hx    Esophageal cancer Neg Hx    Rectal cancer Neg Hx    Stomach cancer Neg Hx     Medications- reviewed and updated Current Outpatient Medications  Medication Sig Dispense Refill   Acetaminophen (TYLENOL PO) Take 500 mg by mouth as needed.     amLODipine (NORVASC) 2.5  MG tablet Take 1 tablet (2.5 mg total) by mouth daily. 90 tablet 3   metoprolol tartrate (LOPRESSOR) 25 MG tablet TAKE 1 TABLET BY MOUTH TWICE A DAY 180 tablet 3   No current facility-administered medications for this visit.    Allergies-reviewed and updated No Known Allergies  Social History   Socioeconomic History   Marital status: Married    Spouse name: Not on file    Number of children: Not on file   Years of education: Not on file   Highest education level: Not on file  Occupational History   Not on file  Tobacco Use   Smoking status: Never   Smokeless tobacco: Never  Vaping Use   Vaping Use: Never used  Substance and Sexual Activity   Alcohol use: Never   Drug use: Never   Sexual activity: Yes  Other Topics Concern   Not on file  Social History Narrative   Married   Works in Data processing manager   Social Determinants of Corporate investment banker Strain: Not on file  Food Insecurity: Not on file  Transportation Needs: Not on file  Physical Activity: Not on file  Stress: Not on file  Social Connections: Not on file        Objective:  Physical Exam: BP 133/73   Pulse (!) 57   Temp 97.8 F (36.6 C) (Temporal)   Ht 6\' 2"  (1.88 m)   Wt 234 lb 9.6 oz (106.4 kg)   SpO2 98%   BMI 30.12 kg/m   Body mass index is 30.12 kg/m. Wt Readings from Last 3 Encounters:  11/11/22 234 lb 9.6 oz (106.4 kg)  09/05/22 227 lb 6.4 oz (103.1 kg)  08/22/22 230 lb 6.4 oz (104.5 kg)   Gen: NAD, resting comfortably HEENT: TMs normal bilaterally. OP clear. No thyromegaly noted.  CV: RRR with no murmurs appreciated Pulm: NWOB, CTAB with no crackles, wheezes, or rhonchi GI: Normal bowel sounds present. Soft, Nontender, Nondistended. MSK: no edema, cyanosis, or clubbing noted Skin: warm, dry Neuro: CN2-12 grossly intact. Strength 5/5 in upper and lower extremities. Reflexes symmetric and intact bilaterally.  Psych: Normal affect and thought content     Karianne Nogueira M. Jimmey Ralph, MD 11/11/2022 1:48 PM

## 2022-11-11 NOTE — Assessment & Plan Note (Signed)
Blood pressure well-controlled today on current regimen amlodipine 2.5 mg daily and Metroprolol tartrate 25 mg twice daily.

## 2022-11-11 NOTE — Assessment & Plan Note (Signed)
Regular rate and rhythm today.  On metoprolol tartrate 25 mg twice daily

## 2022-11-11 NOTE — Assessment & Plan Note (Signed)
Is having a little bit worse pain low back and bilateral hip pain.  He can use over-the-counter meds as needed.  Advised him to follow-up with orthopedics if this continues to be an issue.

## 2022-11-11 NOTE — Assessment & Plan Note (Signed)
Has follow-up with urology for this in the past.  Not currently needed medications.  Check PSA today.

## 2022-11-11 NOTE — Patient Instructions (Signed)
It was very nice to see you today!  We we will check blood work today.  Please continue to work on diet and exercise.  We will see you back in a year for your next physical.  Come back sooner if needed.  Take care, Dr Jimmey Ralph  PLEASE NOTE:  If you had any lab tests, please let us know if you have not heard back within a few days. You may see your results on mychart before we have a chance to review them but we will give you a call once they are reviewed by [place].   If we ordered any referrals today, please let us know if you have not heard from their office within the next week.   If you had any urgent prescriptions sent in today, please check with the pharmacy within an hour of our visit to make sure the prescription was transmitted appropriately.   Please try these tips to maintain a healthy lifestyle:  Eat at least 3 REAL meals and 1-2 snacks per day.  Aim for no more than 5 hours between eating.  If you eat breakfast, please do so within one hour of getting up.   Each meal should contain half fruits/vegetables, one quarter protein, and one quarter carbs (no bigger than a computer mouse)  Cut down on sweet beverages. This includes juice, soda, and sweet tea.   Drink at least 1 glass of water with each meal and aim for at least 8 glasses per day  Exercise at least 150 minutes every week.    Preventive Care 3-59 Years Old, Male Preventive care refers to lifestyle choices and visits with your health care provider that can promote health and wellness. Preventive care visits are also called wellness exams. What can I expect for my preventive care visit? Counseling During your preventive care visit, your health care provider may ask about your: Medical history, including: Past medical problems. Family medical history. Current health, including: Emotional well-being. Home life and relationship well-being. Sexual activity. Lifestyle, including: Alcohol, nicotine or tobacco, and  drug use. Access to firearms. Diet, exercise, and sleep habits. Safety issues such as seatbelt and bike helmet use. Sunscreen use. Work and work Astronomer. Physical exam Your health care provider will check your: Height and weight. These may be used to calculate your BMI (body mass index). BMI is a measurement that tells if you are at a healthy weight. Waist circumference. This measures the distance around your waistline. This measurement also tells if you are at a healthy weight and may help predict your risk of certain diseases, such as type 2 diabetes and high blood pressure. Heart rate and blood pressure. Body temperature. Skin for abnormal spots. What immunizations do I need?  Vaccines are usually given at various ages, according to a schedule. Your health care provider will recommend vaccines for you based on your age, medical history, and lifestyle or other factors, such as travel or where you work. What tests do I need? Screening Your health care provider may recommend screening tests for certain conditions. This may include: Lipid and cholesterol levels. Diabetes screening. This is done by checking your blood sugar (glucose) after you have not eaten for a while (fasting). Hepatitis B test. Hepatitis C test. HIV (human immunodeficiency virus) test. STI (sexually transmitted infection) testing, if you are at risk. Lung cancer screening. Prostate cancer screening. Colorectal cancer screening. Talk with your health care provider about your test results, treatment options, and if necessary, the need for more  tests. Follow these instructions at home: Eating and drinking  Eat a diet that includes fresh fruits and vegetables, whole grains, lean protein, and low-fat dairy products. Take vitamin and mineral supplements as recommended by your health care provider. Do not drink alcohol if your health care provider tells you not to drink. If you drink alcohol: Limit how much you  have to 0-2 drinks a day. Know how much alcohol is in your drink. In the [place] one drink equals one 12 oz bottle of beer (355 mL), one 5 oz glass of wine (148 mL), or one 1 oz glass of hard liquor (44 mL). Lifestyle Brush your teeth every morning and night with fluoride toothpaste. Floss one time each day. Exercise for at least 30 minutes 5 or more days each week. Do not use any products that contain nicotine or tobacco. These products include cigarettes, chewing tobacco, and vaping devices, such as e-cigarettes. If you need help quitting, ask your health care provider. Do not use drugs. If you are sexually active, practice safe sex. Use a condom or other form of protection to prevent STIs. Take aspirin only as told by your health care provider. Make sure that you understand how much to take and what form to take. Work with your health care provider to find out whether it is safe and beneficial for you to take aspirin daily. Find healthy ways to manage stress, such as: Meditation, yoga, or listening to music. Journaling. Talking to a trusted person. Spending time with friends and family. Minimize exposure to UV radiation to reduce your risk of skin cancer. Safety Always wear your seat belt while driving or riding in a vehicle. Do not drive: If you have been drinking alcohol. Do not ride with someone who has been drinking. When you are tired or distracted. While texting. If you have been using any mind-altering substances or drugs. Wear a helmet and other protective equipment during sports activities. If you have firearms in your house, make sure you follow all gun safety procedures. What's next? Go to your health care provider once a year for an annual wellness visit. Ask your health care provider how often you should have your eyes and teeth checked. Stay up to date on all vaccines. This information is not intended to replace advice given to you by your health care provider. Make sure  you discuss any questions you have with your health care provider. Document Revised: 01/20/2021 Document Reviewed: 01/20/2021 Elsevier Patient Education  [place].

## 2022-11-14 NOTE — Progress Notes (Signed)
Please inform patient of the following:  Labs are all normal. Would like for him to keep up the good work and we can recheck in a year or so.  Katina Degree. Jimmey Ralph, MD 11/14/2022 1:11 PM

## 2023-08-04 IMAGING — MR MR KNEE*R* W/O CM
6 series · 40 of 40 positions shown · non-contrast
Comparison: Knee radiographs dated June 23, 2021

CLINICAL DATA: Knee trauma.  Meniscal/ligamentous injury suspected.

EXAM:
MRI OF THE RIGHT KNEE WITHOUT CONTRAST
TECHNIQUE: Multiplanar, multisequence MR imaging of the knee was performed. No
intravenous contrast was administered.

[Series 6: T2 fat-sat · axial · right · 4.0mm · 0.50mm/px · z∈[-69,+85]mm · 7 of 36 slices shown (1 of 3)]
[im 1/36]
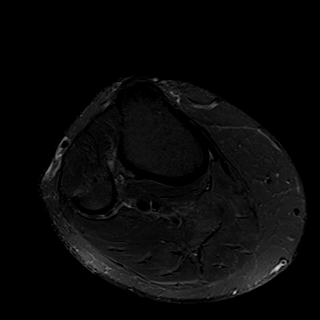
[im 6/36]
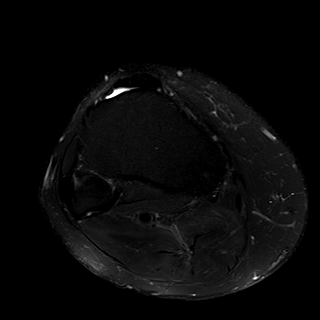
[im 12/36]
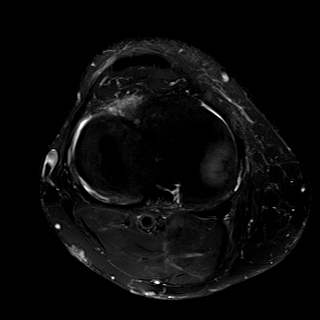
[im 18/36]
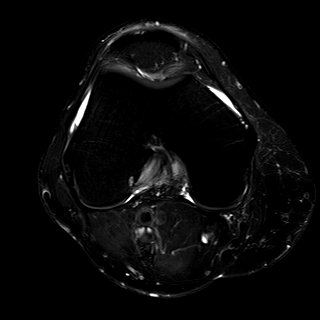
[im 24/36]
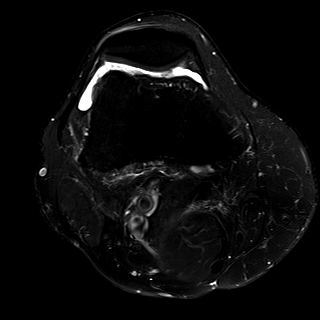
[im 30/36]
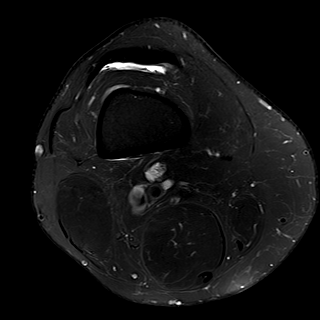
[im 36/36]
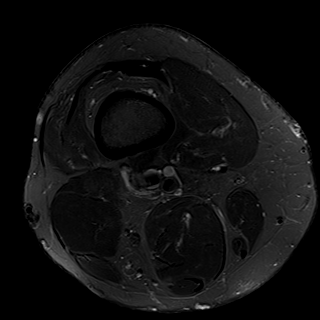

[Series 7: T2 fat-sat · coronal · right · 4.0mm · 0.47mm/px · 6 of 28 slices shown (2 of 3)]
[im 1/28]
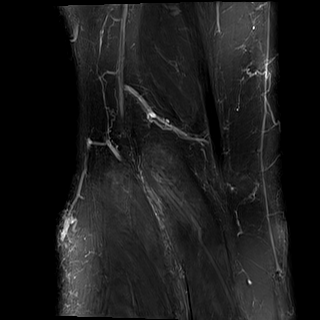
[im 6/28]
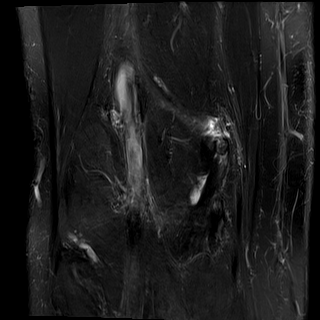
[im 11/28]
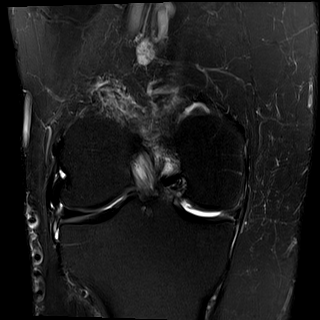
[im 17/28]
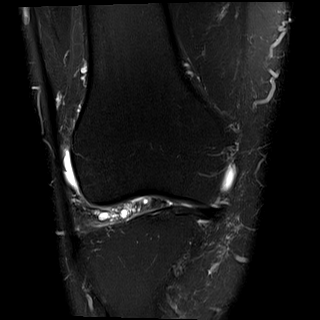
[im 22/28]
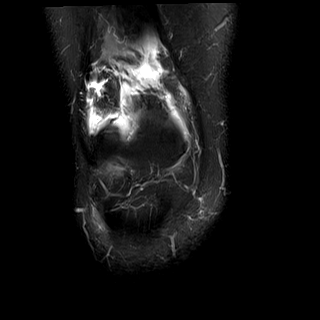
[im 28/28]
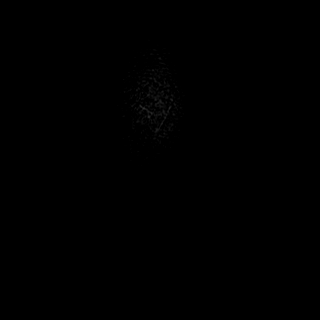

[Series 8: T1 · coronal · right · 4.0mm · 0.47mm/px · 6 of 28 slices shown]
[im 1/28]
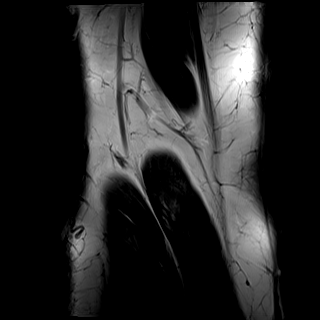
[im 6/28]
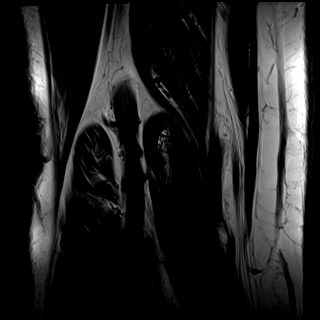
[im 11/28]
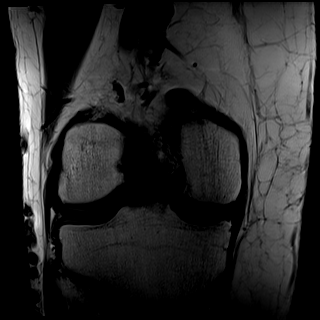
[im 17/28]
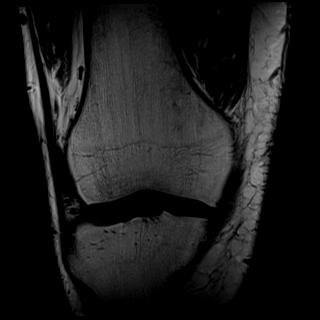
[im 22/28]
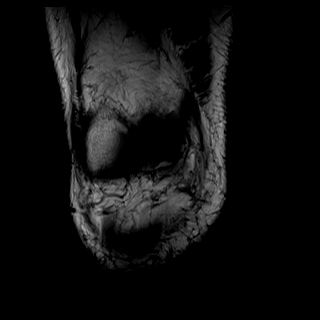
[im 28/28]
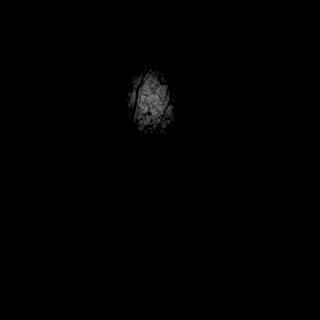

[Series 9: PD fat-sat · coronal · right · 3.0mm · 0.47mm/px · 7 of 32 slices shown (1 of 2)]
[im 1/32]
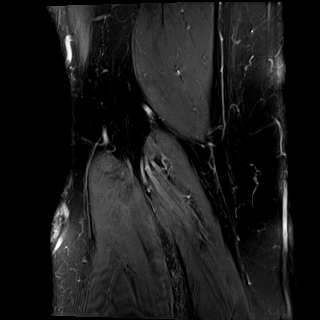
[im 6/32]
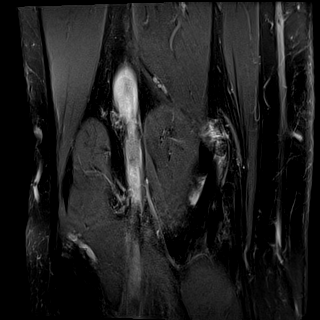
[im 11/32]
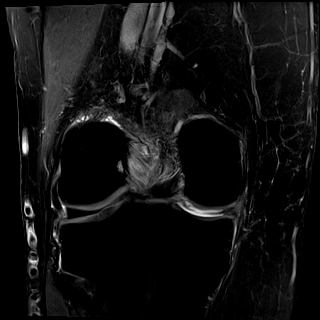
[im 16/32]
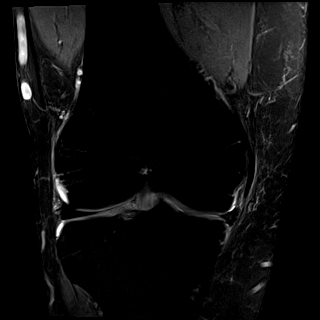
[im 21/32]
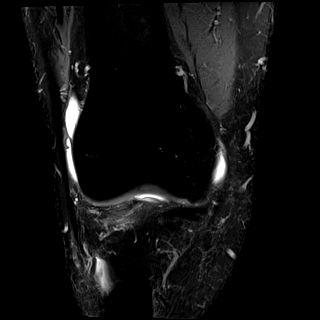
[im 26/32]
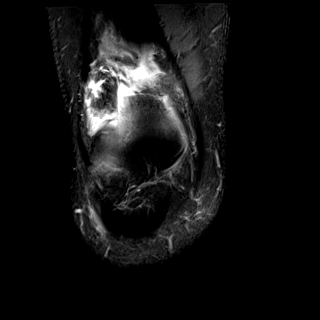
[im 32/32]
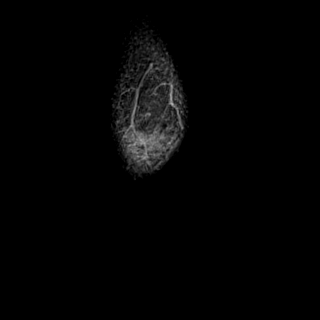

[Series 10: PD fat-sat · sagittal · right · 3.0mm · 0.39mm/px · 7 of 31 slices shown (2 of 2)]
[im 1/31]
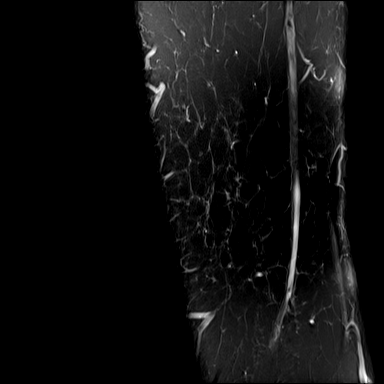
[im 6/31]
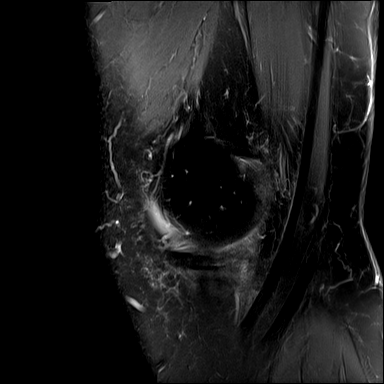
[im 11/31]
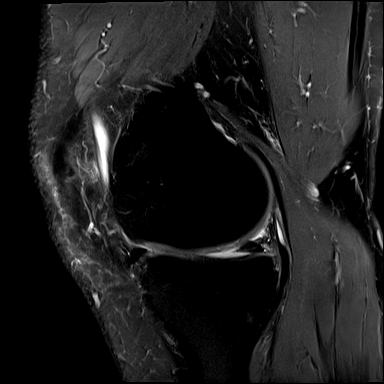
[im 16/31]
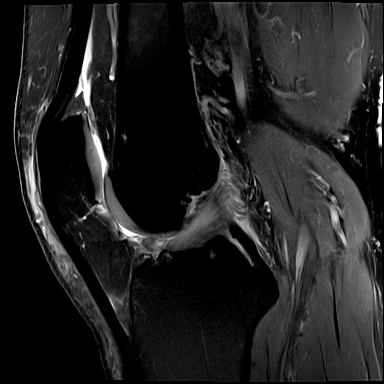
[im 21/31]
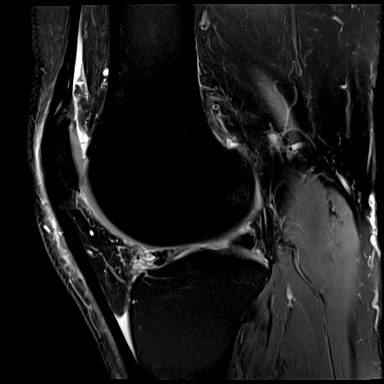
[im 26/31]
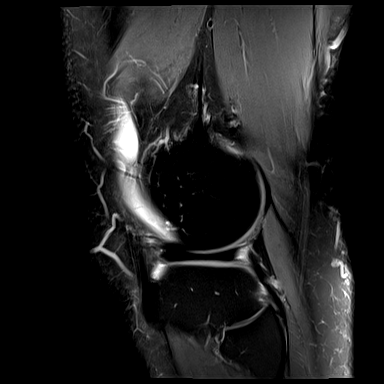
[im 31/31]
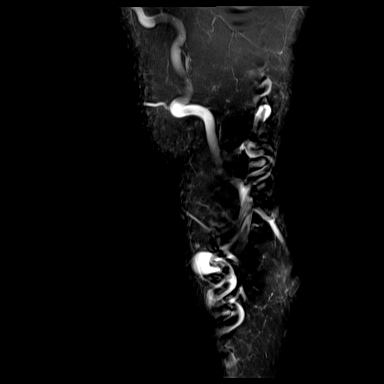

[Series 11: T2 fat-sat · sagittal · right · 3.0mm · 0.39mm/px · 7 of 31 slices shown (3 of 3)]
[im 1/31]
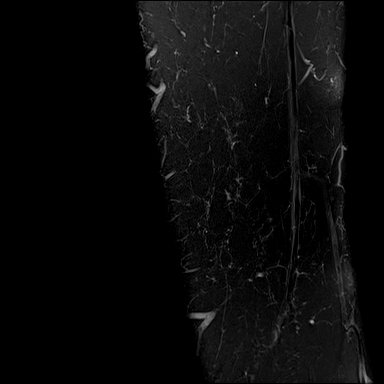
[im 6/31]
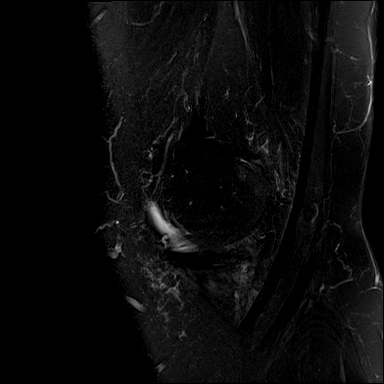
[im 11/31]
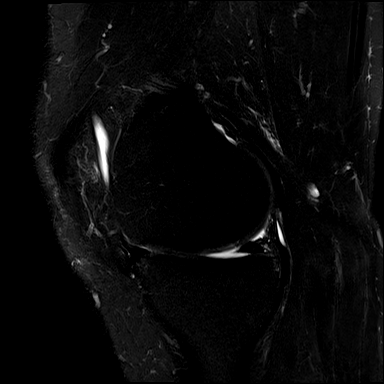
[im 16/31]
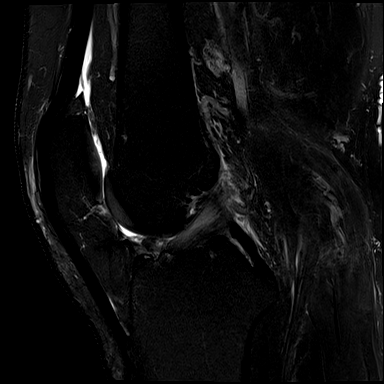
[im 21/31]
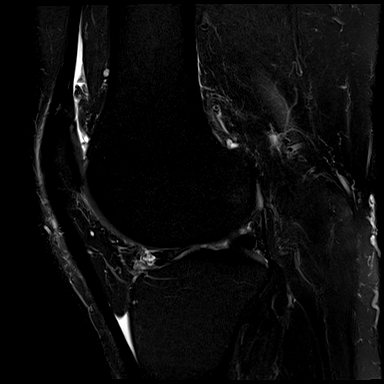
[im 26/31]
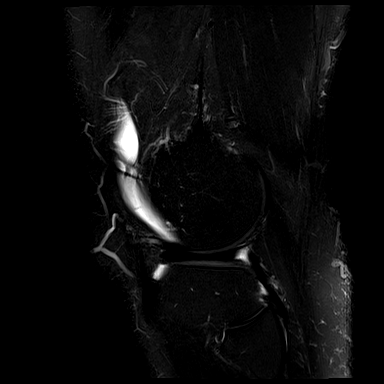
[im 31/31]
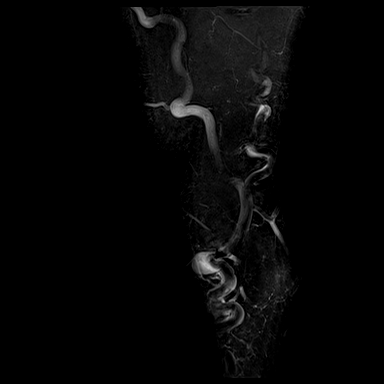

[40 of 40 positions shown; findings below may reference images not displayed]

FINDINGS: MENISCI

Medial meniscus: Chronic degenerative tear of the body/posterior
horn of the medial meniscus

Lateral meniscus:  No acute tear, mild degenerative changes.

LIGAMENTS

Cruciates: Posterior cruciate ligament is intact. Thickening and
heterogeneous signal of the anterior cruciate ligament concerning
for mucoid degeneration. No surrounding edema.

Collaterals:  Medial and lateral collateral ligaments are intact.

CARTILAGE

Patellofemoral:  No chondral defect.

Medial: Articular cartilage thinning without evidence of
full-thickness defect.

Lateral: Articular cartilage thinning without evidence of
full-thickness defect.

Joint:  No joint effusion. Normal Hoffa's fat. No plical thickening.

Popliteal Fossa:  No Baker cyst. Intact popliteus tendon.

Extensor Mechanism:  Intact quadriceps tendon and patellar tendon.

Bones: No focal marrow signal abnormality. No fracture or
dislocation.

Other: None.
IMPRESSION: 1. Chronic degenerative changes of the body/posterior horn of the
medial meniscus as well as generalized articular cartilage thinning
representing mild knee osteoarthritis.

2. Mucoid degeneration of the anterior cruciate ligament without
tear.

3.  No evidence of fracture or dislocation.

4.  No evidence of acute ligamentous injury.

## 2023-08-30 ENCOUNTER — Other Ambulatory Visit: Payer: Self-pay | Admitting: Family Medicine

## 2023-08-30 DIAGNOSIS — I10 Essential (primary) hypertension: Secondary | ICD-10-CM

## 2023-09-25 ENCOUNTER — Other Ambulatory Visit: Payer: Self-pay | Admitting: Family Medicine

## 2023-09-25 MED ORDER — METOPROLOL TARTRATE 25 MG PO TABS: 25.0000 mg | ORAL_TABLET | Freq: Two times a day (BID) | ORAL | 3 refills | Status: AC

## 2023-09-25 NOTE — Telephone Encounter (Signed)
Copied from CRM [id number]. Topic: Clinical - Medication Refill >> Sep 25, 2023  2:25 PM Orinda Kenner C wrote: Most Recent Primary Care Visit:  Provider: Ardith Dark  Department: [facility]  Visit Type: PHYSICAL  Date: 11/11/2022  Medication: metoprolol tartrate (LOPRESSOR) 25 MG tablet  Has the patient contacted their pharmacy? Yes[facility] advised [facility] to contact the office, there's no more refill.  (Agent: If no, request that the patient contact the pharmacy for the refill. If patient does not wish to contact the pharmacy document the reason why and proceed with request.) (Agent: If yes, when and what did the pharmacy advise?)  Is this the correct pharmacy for this prescription? Yes If no, delete pharmacy and type the correct one.  This is the patient's preferred pharmacy:  [id number] - [place], [place] - [address] AT [address] OF [facility] [address] [place] [address] Phone: [phone] Fax: [phone]   Has the prescription been filled recently? No  Is the patient out of the medication? Yes  Has the patient been seen for an appointment in the last year OR does the patient have an upcoming appointment? Yes  Can we respond through MyChart? No, please [phone]  Agent: Please be advised that Rx refills may take up to 3 business days. We ask that you follow-up with your pharmacy.

## 2023-11-03 ENCOUNTER — Ambulatory Visit: Payer: 59 | Admitting: Family Medicine

## 2023-11-13 ENCOUNTER — Encounter: Payer: Self-pay | Admitting: Family Medicine

## 2023-11-13 ENCOUNTER — Ambulatory Visit (INDEPENDENT_AMBULATORY_CARE_PROVIDER_SITE_OTHER): Payer: 59 | Admitting: Family Medicine

## 2023-11-13 VITALS — BP 124/68 | HR 53 | Temp 98.2°F | Ht 74.0 in | Wt 233.2 lb

## 2023-11-13 DIAGNOSIS — I4891 Unspecified atrial fibrillation: Secondary | ICD-10-CM

## 2023-11-13 DIAGNOSIS — Z131 Encounter for screening for diabetes mellitus: Secondary | ICD-10-CM

## 2023-11-13 DIAGNOSIS — Z0000 Encounter for general adult medical examination without abnormal findings: Secondary | ICD-10-CM

## 2023-11-13 DIAGNOSIS — N4340 Spermatocele of epididymis, unspecified: Secondary | ICD-10-CM | POA: Insufficient documentation

## 2023-11-13 DIAGNOSIS — M1990 Unspecified osteoarthritis, unspecified site: Secondary | ICD-10-CM | POA: Diagnosis not present

## 2023-11-13 DIAGNOSIS — I10 Essential (primary) hypertension: Secondary | ICD-10-CM | POA: Diagnosis not present

## 2023-11-13 DIAGNOSIS — N400 Enlarged prostate without lower urinary tract symptoms: Secondary | ICD-10-CM

## 2023-11-13 DIAGNOSIS — Z13220 Encounter for screening for lipoid disorders: Secondary | ICD-10-CM

## 2023-11-13 LAB — COMPREHENSIVE METABOLIC PANEL WITH GFR
ALT: 17 U/L (ref 0–53)
AST: 15 U/L (ref 0–37)
Albumin: 4.4 g/dL (ref 3.5–5.2)
Alkaline Phosphatase: 45 U/L (ref 39–117)
BUN: 20 mg/dL (ref 6–23)
CO2: 28 meq/L (ref 19–32)
Calcium: 9.7 mg/dL (ref 8.4–10.5)
Chloride: 104 meq/L (ref 96–112)
Creatinine, Ser: 0.86 mg/dL (ref 0.40–1.50)
GFR: 94.78 mL/min (ref >60.00)
Glucose, Bld: 106 mg/dL — ABNORMAL HIGH (ref 70–99)
Potassium: 4.4 meq/L (ref 3.5–5.1)
Sodium: 139 meq/L (ref 135–145)
Total Bilirubin: 0.8 mg/dL (ref 0.2–1.2)
Total Protein: 6.7 g/dL (ref 6.0–8.3)

## 2023-11-13 LAB — URINALYSIS, ROUTINE W REFLEX MICROSCOPIC
Bilirubin Urine: NEGATIVE
Hgb urine dipstick: NEGATIVE
Ketones, ur: NEGATIVE
Leukocytes,Ua: NEGATIVE
Nitrite: NEGATIVE
RBC / HPF: NONE SEEN (ref 0–?)
Specific Gravity, Urine: 1.025 (ref 1.000–1.030)
Total Protein, Urine: NEGATIVE
Urine Glucose: NEGATIVE
Urobilinogen, UA: 0.2 (ref 0.0–1.0)
pH: 6 (ref 5.0–8.0)

## 2023-11-13 LAB — HEMOGLOBIN A1C: Hgb A1c MFr Bld: 5.4 % (ref 4.6–6.5)

## 2023-11-13 LAB — CBC
HCT: 42.4 % (ref 39.0–52.0)
Hemoglobin: 14.8 g/dL (ref 13.0–17.0)
MCHC: 34.9 g/dL (ref 30.0–36.0)
MCV: 90.4 fl (ref 78.0–100.0)
Platelets: 199 10*3/uL (ref 150.0–400.0)
RBC: 4.69 Mil/uL (ref 4.22–5.81)
RDW: 13.3 % (ref 11.5–15.5)
WBC: 5.1 10*3/uL (ref 4.0–10.5)

## 2023-11-13 LAB — LIPID PANEL
Cholesterol: 149 mg/dL (ref 0–200)
HDL: 39.5 mg/dL (ref >39.00)
LDL Cholesterol: 89 mg/dL (ref 0–99)
NonHDL: 109.2
Total CHOL/HDL Ratio: 4
Triglycerides: 100 mg/dL (ref 0.0–149.0)
VLDL: 20 mg/dL (ref 0.0–40.0)

## 2023-11-13 LAB — PSA: PSA: 1.06 ng/mL (ref 0.10–4.00)

## 2023-11-13 LAB — TSH: TSH: 1.02 u[IU]/mL (ref 0.35–5.50)

## 2023-11-13 NOTE — Progress Notes (Signed)
 Chief Complaint:  Shane Miles is a 58 y.o. male who presents today for his annual comprehensive physical exam.    Assessment/Plan:  Chronic Problems Addressed Today: Spermatocele Diagnosed with this 20 years ago via ultrasound.  Does have a soft mass on exam today consistent with likely spermatocele though may have developed varicocele as well.  We did discuss repeat ultrasound however would like to hold off on for now.  He will let us know if he changes his mind or if symptoms change.  We also did discuss referral to urology however he would like to hold off on this for now as well.  Last saw them a couple of years ago.  Essential hypertension At goal today on amlodipine 2.5 mg daily and Metropol tartrate 25 mg twice daily.  Osteoarthritis Stable on over-the-counter meds as needed.  Benign prostatic hyperplasia Symptoms are persistent though overall manageable.  He has tried alpha blockers and finasteride in the past without much improvement.  We discussed having him go back to see urology however he would like to hold off on that for now.  Will check PSA today.  Atrial fibrillation (HCC) Regular rate and rhythm today.  Rate controlled on metoprolol tartrate 25 mg twice daily.   Preventative Healthcare: Check labs.  Tdap declined.  Due for colonoscopy later this year.  Patient Counseling(The following topics were reviewed and/or handout was given):  -Nutrition: Stressed importance of moderation in sodium/caffeine intake, saturated fat and cholesterol, caloric balance, sufficient intake of fresh fruits, vegetables, and fiber.  -Stressed the importance of regular exercise.   -Substance Abuse: Discussed cessation/primary prevention of tobacco, alcohol, or other drug use; driving or other dangerous activities under the influence; availability of treatment for abuse.   -Injury prevention: Discussed safety belts, safety helmets, smoke detector, smoking near bedding or upholstery.    -Sexuality: Discussed sexually transmitted diseases, partner selection, use of condoms, avoidance of unintended pregnancy and contraceptive alternatives.   -Dental health: Discussed importance of regular tooth brushing, flossing, and dental visits.  -Health maintenance and immunizations reviewed. Please refer to Health maintenance section.  Return to care in 1 year for next preventative visit.     Subjective:  HPI:  See Assessment / plan for status of chronic conditions.  He has no acute concerns today.  He has been having more testicular pain and swelling.  He was diagnosed with a spermatocele about 20 years ago.  Has been following with urology intermittently for this as well.  He has noticed the area has grown in size elevated at last several months.  A little more pain as well.  Is also had ongoing issues with frequent urination and urinary hesitancy.  Also is following with urology for this.  Been on a few medications for this in the past including tamsulosin, alfuzosin, and finasteride without much improvement.  Overall symptoms are manageable but he would like to make sure that he does not have prostate cancer.   Lifestyle Diet: Cutting down on sodium. More fruits and vegetables.  Exercise: Walking. Some resistance training.      11/13/2023    7:29 AM  Depression screen PHQ 2/9  Decreased Interest 0  Down, Depressed, Hopeless 0  PHQ - 2 Score 0    Health Maintenance Due  Topic Date Due   HIV Screening  Never done   Hepatitis C Screening  Never done   DTaP/Tdap/Td (1 - Tdap) Never done   COVID-19 Vaccine (3 - 2024-25 season) 04/09/2023   Colonoscopy  02/06/2024     ROS: Per HPI, otherwise a complete review of systems was negative.   PMH:  The following were reviewed and entered/updated in epic: Past Medical History:  Diagnosis Date   Arthritis    Patient Active Problem List   Diagnosis Date Noted   Spermatocele 11/13/2023   Atrial fibrillation (HCC) 11/02/2021    Benign prostatic hyperplasia 03/03/2021   Osteoarthritis 03/05/2020   Essential hypertension 02/03/2020   Past Surgical History:  Procedure Laterality Date   APPENDECTOMY  2003   CARDIOVERSION N/A 05/25/2021   Procedure: CARDIOVERSION;  Surgeon: Pricilla Riffle, MD;  Location: [facility];  Service: Cardiovascular;  Laterality: N/A;   FOREIGN BODY REMOVAL Right    right thing   left knee meniscus  2010   right knee meniscus  2013    Family History  Problem Relation Age of Onset   Arthritis Mother    Hypertension Mother    Hypertension Father    Colon cancer Neg Hx    Colon polyps Neg Hx    Esophageal cancer Neg Hx    Rectal cancer Neg Hx    Stomach cancer Neg Hx     Medications- reviewed and updated Current Outpatient Medications  Medication Sig Dispense Refill   Acetaminophen (TYLENOL PO) Take 500 mg by mouth as needed.     amLODipine (NORVASC) 2.5 MG tablet TAKE 1 TABLET BY MOUTH EVERY DAY 90 tablet 3   metoprolol tartrate (LOPRESSOR) 25 MG tablet Take 1 tablet (25 mg total) by mouth 2 (two) times daily. 180 tablet 3   No current facility-administered medications for this visit.    Allergies-reviewed and updated No Known Allergies  Social History   Socioeconomic History   Marital status: Married    Spouse name: Not on file   Number of children: Not on file   Years of education: Not on file   Highest education level: Not on file  Occupational History   Not on file  Tobacco Use   Smoking status: Never   Smokeless tobacco: Never  Vaping Use   Vaping status: Never Used  Substance and Sexual Activity   Alcohol use: Never   Drug use: Never   Sexual activity: Yes  Other Topics Concern   Not on file  Social History Narrative   Married   Works in Data processing manager   Social Drivers of Corporate investment banker Strain: Not on file  Food Insecurity: Not on file  Transportation Needs: Not on file  Physical Activity: Not on file  Stress: Not on file  Social  Connections: Not on file        Objective:  Physical Exam: BP 124/68   Pulse (!) 53   Temp 98.2 F (36.8 C) (Temporal)   Ht 6\' 2"  (1.88 m)   Wt 233 lb 3.2 oz (105.8 kg)   SpO2 98%   BMI 29.94 kg/m   Body mass index is 29.94 kg/m. Wt Readings from Last 3 Encounters:  11/13/23 233 lb 3.2 oz (105.8 kg)  11/11/22 234 lb 9.6 oz (106.4 kg)  09/05/22 227 lb 6.4 oz (103.1 kg)   Gen: NAD, resting comfortably HEENT: TMs normal bilaterally. OP clear. No thyromegaly noted.  CV: RRR with no murmurs appreciated Pulm: NWOB, CTAB with no crackles, wheezes, or rhonchi GI: Normal bowel sounds present. Soft, Nontender, Nondistended. MSK: no edema, cyanosis, or clubbing noted GU: Normal male genitalia.  Soft mass noted along anterior aspect of right testicle consistent with spermatocele versus varicocele.  Skin: warm, dry Neuro: CN2-12 grossly intact. Strength 5/5 in upper and lower extremities. Reflexes symmetric and intact bilaterally.  Psych: Normal affect and thought content     Courteney Alderete M. Jimmey Ralph, MD 11/13/2023 7:58 AM

## 2023-11-13 NOTE — Patient Instructions (Addendum)
 It was very nice to see you today!  We will check blood and urine and sample today.   Please let us know if you would like for [place] to get an ultrasound.   Please keep up the great work with your diet and exercise.  Return in about 1 year (around 11/12/2024) for Annual Physical.   Take care, Dr Jimmey Ralph  PLEASE NOTE:  If you had any lab tests, please let us know if you have not heard back within a few days. You may see your results on mychart before we have a chance to review them but we will give you a call once they are reviewed by [place].   If we ordered any referrals today, please let us know if you have not heard from their office within the next week.   If you had any urgent prescriptions sent in today, please check with the pharmacy within an hour of our visit to make sure the prescription was transmitted appropriately.   Please try these tips to maintain a healthy lifestyle:  Eat at least 3 REAL meals and 1-2 snacks per day.  Aim for no more than 5 hours between eating.  If you eat breakfast, please do so within one hour of getting up.   Each meal should contain half fruits/vegetables, one quarter protein, and one quarter carbs (no bigger than a computer mouse)  Cut down on sweet beverages. This includes juice, soda, and sweet tea.   Drink at least 1 glass of water with each meal and aim for at least 8 glasses per day  Exercise at least 150 minutes every week.    Preventive Care 61-73 Years Old, Male Preventive care refers to lifestyle choices and visits with your health care provider that can promote health and wellness. Preventive care visits are also called wellness exams. What can I expect for my preventive care visit? Counseling During your preventive care visit, your health care provider may ask about your: Medical history, including: Past medical problems. Family medical history. Current health, including: Emotional well-being. Home life and relationship  well-being. Sexual activity. Lifestyle, including: Alcohol, nicotine or tobacco, and drug use. Access to firearms. Diet, exercise, and sleep habits. Safety issues such as seatbelt and bike helmet use. Sunscreen use. Work and work Astronomer. Physical exam Your health care provider will check your: Height and weight. These may be used to calculate your BMI (body mass index). BMI is a measurement that tells if you are at a healthy weight. Waist circumference. This measures the distance around your waistline. This measurement also tells if you are at a healthy weight and may help predict your risk of certain diseases, such as type 2 diabetes and high blood pressure. Heart rate and blood pressure. Body temperature. Skin for abnormal spots. What immunizations do I need?  Vaccines are usually given at various ages, according to a schedule. Your health care provider will recommend vaccines for you based on your age, medical history, and lifestyle or other factors, such as travel or where you work. What tests do I need? Screening Your health care provider may recommend screening tests for certain conditions. This may include: Lipid and cholesterol levels. Diabetes screening. This is done by checking your blood sugar (glucose) after you have not eaten for a while (fasting). Hepatitis B test. Hepatitis C test. HIV (human immunodeficiency virus) test. STI (sexually transmitted infection) testing, if you are at risk. Lung cancer screening. Prostate cancer screening. Colorectal cancer screening. Talk with your health  care provider about your test results, treatment options, and if necessary, the need for more tests. Follow these instructions at home: Eating and drinking  Eat a diet that includes fresh fruits and vegetables, whole grains, lean protein, and low-fat dairy products. Take vitamin and mineral supplements as recommended by your health care provider. Do not drink alcohol if your  health care provider tells you not to drink. If you drink alcohol: Limit how much you have to 0-2 drinks a day. Know how much alcohol is in your drink. In the [place] one drink equals one 12 oz bottle of beer (355 mL), one 5 oz glass of wine (148 mL), or one 1 oz glass of hard liquor (44 mL). Lifestyle Brush your teeth every morning and night with fluoride toothpaste. Floss one time each day. Exercise for at least 30 minutes 5 or more days each week. Do not use any products that contain nicotine or tobacco. These products include cigarettes, chewing tobacco, and vaping devices, such as e-cigarettes. If you need help quitting, ask your health care provider. Do not use drugs. If you are sexually active, practice safe sex. Use a condom or other form of protection to prevent STIs. Take aspirin only as told by your health care provider. Make sure that you understand how much to take and what form to take. Work with your health care provider to find out whether it is safe and beneficial for you to take aspirin daily. Find healthy ways to manage stress, such as: Meditation, yoga, or listening to music. Journaling. Talking to a trusted person. Spending time with friends and family. Minimize exposure to UV radiation to reduce your risk of skin cancer. Safety Always wear your seat belt while driving or riding in a vehicle. Do not drive: If you have been drinking alcohol. Do not ride with someone who has been drinking. When you are tired or distracted. While texting. If you have been using any mind-altering substances or drugs. Wear a helmet and other protective equipment during sports activities. If you have firearms in your house, make sure you follow all gun safety procedures. What's next? Go to your health care provider once a year for an annual wellness visit. Ask your health care provider how often you should have your eyes and teeth checked. Stay up to date on all vaccines. This information  is not intended to replace advice given to you by your health care provider. Make sure you discuss any questions you have with your health care provider. Document Revised: 01/20/2021 Document Reviewed: 01/20/2021 Elsevier Patient Education  2024 [employer].

## 2023-11-13 NOTE — Assessment & Plan Note (Signed)
 Diagnosed with this 20 years ago via ultrasound.  Does have a soft mass on exam today consistent with likely spermatocele though may have developed varicocele as well.  We did discuss repeat ultrasound however would like to hold off on for now.  He will let us know if he changes his mind or if symptoms change.  We also did discuss referral to urology however he would like to hold off on this for now as well.  Last saw them a couple of years ago.

## 2023-11-13 NOTE — Assessment & Plan Note (Signed)
Stable on over-the-counter meds as needed. 

## 2023-11-13 NOTE — Assessment & Plan Note (Signed)
 At goal today on amlodipine 2.5 mg daily and Metropol tartrate 25 mg twice daily.

## 2023-11-13 NOTE — Assessment & Plan Note (Signed)
 Symptoms are persistent though overall manageable.  He has tried alpha blockers and finasteride in the past without much improvement.  We discussed having him go back to see urology however he would like to hold off on that for now.  Will check PSA today.

## 2023-11-13 NOTE — Assessment & Plan Note (Signed)
 Regular rate and rhythm today.  Rate controlled on metoprolol tartrate 25 mg twice daily.

## 2023-11-14 LAB — URINE CULTURE
MICRO NUMBER:: 16297283
Result:: NO GROWTH
SPECIMEN QUALITY:: ADEQUATE

## 2023-11-15 ENCOUNTER — Encounter: Payer: Self-pay | Admitting: Family Medicine

## 2023-11-15 NOTE — Progress Notes (Signed)
 His labs are all at goal.  No signs of UTI.  Prostate levels are normal.  Blood sugar and cholesterol levels are at goal.  Do not need to make any changes to his treatment plan at this time.  He should keep up the great work with diet and exercise and we can recheck again in a year or so.

## 2024-09-04 ENCOUNTER — Other Ambulatory Visit: Payer: Self-pay | Admitting: Family Medicine

## 2024-09-04 DIAGNOSIS — I10 Essential (primary) hypertension: Secondary | ICD-10-CM

## 2024-11-14 ENCOUNTER — Encounter: Admitting: Family Medicine

## 2025-01-06 ENCOUNTER — Encounter: Admitting: Family Medicine
